# Patient Record
Sex: Male | Born: 1950 | Race: White | Hispanic: No | Marital: Married | Smoking: Former smoker
Health system: Southern US, Community
[De-identification: ages and names within clinical notes are randomized; demographics above are authoritative.]

## PROBLEM LIST (undated history)

## (undated) DIAGNOSIS — Z8719 Personal history of other diseases of the digestive system: Secondary | ICD-10-CM

## (undated) DIAGNOSIS — K5792 Diverticulitis of intestine, part unspecified, without perforation or abscess without bleeding: Secondary | ICD-10-CM

## (undated) DIAGNOSIS — N400 Enlarged prostate without lower urinary tract symptoms: Secondary | ICD-10-CM

## (undated) DIAGNOSIS — I10 Essential (primary) hypertension: Secondary | ICD-10-CM

## (undated) DIAGNOSIS — J189 Pneumonia, unspecified organism: Secondary | ICD-10-CM

## (undated) DIAGNOSIS — E785 Hyperlipidemia, unspecified: Secondary | ICD-10-CM

## (undated) DIAGNOSIS — K219 Gastro-esophageal reflux disease without esophagitis: Secondary | ICD-10-CM

## (undated) DIAGNOSIS — J45909 Unspecified asthma, uncomplicated: Secondary | ICD-10-CM

## (undated) DIAGNOSIS — E213 Hyperparathyroidism, unspecified: Secondary | ICD-10-CM

## (undated) DIAGNOSIS — R631 Polydipsia: Secondary | ICD-10-CM

## (undated) DIAGNOSIS — M1990 Unspecified osteoarthritis, unspecified site: Secondary | ICD-10-CM

## (undated) HISTORY — DX: Polydipsia: R63.1

## (undated) HISTORY — DX: Gastro-esophageal reflux disease without esophagitis: K21.9

## (undated) HISTORY — PX: TONSILLECTOMY AND ADENOIDECTOMY: SUR1326

## (undated) HISTORY — PX: HERNIA REPAIR: SHX51

## (undated) HISTORY — DX: Diverticulitis of intestine, part unspecified, without perforation or abscess without bleeding: K57.92

## (undated) HISTORY — DX: Essential (primary) hypertension: I10

## (undated) HISTORY — DX: Hyperparathyroidism, unspecified: E21.3

## (undated) HISTORY — DX: Hyperlipidemia, unspecified: E78.5

## (undated) HISTORY — DX: Unspecified asthma, uncomplicated: J45.909

## (undated) HISTORY — DX: Benign prostatic hyperplasia without lower urinary tract symptoms: N40.0

---

## 2016-10-02 HISTORY — DX: Hypercalcemia: E83.52

## 2017-07-09 ENCOUNTER — Telehealth: Payer: Self-pay | Admitting: Internal Medicine

## 2017-07-09 NOTE — Telephone Encounter (Signed)
Patient just moved here from [place].  Would like to have an internist.  Would you be able to take patient on?

## 2017-07-09 NOTE — Telephone Encounter (Signed)
Pt called back an has decided to sch with [facility]

## 2017-08-20 ENCOUNTER — Encounter: Payer: Self-pay | Admitting: Nurse Practitioner

## 2017-08-20 ENCOUNTER — Ambulatory Visit (INDEPENDENT_AMBULATORY_CARE_PROVIDER_SITE_OTHER): Payer: Medicare Other | Admitting: Nurse Practitioner

## 2017-08-20 ENCOUNTER — Other Ambulatory Visit (INDEPENDENT_AMBULATORY_CARE_PROVIDER_SITE_OTHER): Payer: Medicare Other

## 2017-08-20 VITALS — BP 138/76 | HR 62 | Temp 98.5°F | Resp 16 | Ht 70.0 in | Wt 200.0 lb

## 2017-08-20 DIAGNOSIS — K5792 Diverticulitis of intestine, part unspecified, without perforation or abscess without bleeding: Secondary | ICD-10-CM | POA: Insufficient documentation

## 2017-08-20 DIAGNOSIS — R972 Elevated prostate specific antigen [PSA]: Secondary | ICD-10-CM | POA: Diagnosis not present

## 2017-08-20 DIAGNOSIS — F4310 Post-traumatic stress disorder, unspecified: Secondary | ICD-10-CM

## 2017-08-20 DIAGNOSIS — E785 Hyperlipidemia, unspecified: Secondary | ICD-10-CM | POA: Insufficient documentation

## 2017-08-20 DIAGNOSIS — N400 Enlarged prostate without lower urinary tract symptoms: Secondary | ICD-10-CM | POA: Diagnosis not present

## 2017-08-20 DIAGNOSIS — I10 Essential (primary) hypertension: Secondary | ICD-10-CM | POA: Diagnosis not present

## 2017-08-20 DIAGNOSIS — K219 Gastro-esophageal reflux disease without esophagitis: Secondary | ICD-10-CM | POA: Insufficient documentation

## 2017-08-20 DIAGNOSIS — Z7289 Other problems related to lifestyle: Secondary | ICD-10-CM

## 2017-08-20 DIAGNOSIS — Z789 Other specified health status: Secondary | ICD-10-CM | POA: Diagnosis not present

## 2017-08-20 LAB — COMPREHENSIVE METABOLIC PANEL
ALBUMIN: 4.6 g/dL (ref 3.5–5.2)
ALT: 24 U/L (ref 0–53)
AST: 18 U/L (ref 0–37)
Alkaline Phosphatase: 60 U/L (ref 39–117)
BUN: 21 mg/dL (ref 6–23)
CALCIUM: 10.7 mg/dL — AB (ref 8.4–10.5)
CHLORIDE: 105 meq/L (ref 96–112)
CO2: 28 mEq/L (ref 19–32)
CREATININE: 1.19 mg/dL (ref 0.40–1.50)
GFR: 65.19 mL/min (ref >60.00)
Glucose, Bld: 89 mg/dL (ref 70–99)
Potassium: 4.8 mEq/L (ref 3.5–5.1)
Sodium: 137 mEq/L (ref 135–145)
Total Bilirubin: 0.8 mg/dL (ref 0.2–1.2)
Total Protein: 7 g/dL (ref 6.0–8.3)

## 2017-08-20 LAB — PSA: PSA: 8.08 ng/mL — ABNORMAL HIGH (ref 0.10–4.00)

## 2017-08-20 MED ORDER — SIMVASTATIN 20 MG PO TABS: 20.0000 mg | ORAL_TABLET | Freq: Every day | ORAL | 2 refills | Status: DC

## 2017-08-20 MED ORDER — SERTRALINE HCL 100 MG PO TABS: 100.0000 mg | ORAL_TABLET | Freq: Every day | ORAL | 2 refills | Status: DC

## 2017-08-20 MED ORDER — PRAZOSIN HCL 5 MG PO CAPS: 5.0000 mg | ORAL_CAPSULE | Freq: Two times a day (BID) | ORAL | 2 refills | Status: DC

## 2017-08-20 NOTE — Assessment & Plan Note (Signed)
Readings below goal of 140/90 in office today. Maintained on prazosin 5mg  twice daily as cotherapy for HTN/BPH. Denies adverse medication effects. Denies headaches, vision changes, chest pain, shortness of breath. Will continue prazosin at current dose. Healthy diet and exercise, BP logs at home discussed CMET today.

## 2017-08-20 NOTE — Assessment & Plan Note (Addendum)
Heavy alcohol use, sometimes 1 bottle of wine a day, but does not drink every single day. He is not ready to quit drinking. He declines referral to psychiatry, alcohol resources.

## 2017-08-20 NOTE — Assessment & Plan Note (Signed)
Maintained on prazosin 5mg  twice daily. He reports elevated PSA readings in past despite prazosin therapy. He was seeing a urologist in [place] but stopped follow up because he did not want to continue routine prostate biopsies. PSA today, will maintain prazosin for now.

## 2017-08-20 NOTE — Progress Notes (Addendum)
Subjective:    Patient ID: Matthew Walsh, male    DOB: 12-28-1950, 66 y.o.   MRN: [id number]  HPI Matthew Walsh is who presents today to establish care. He has just moved to [place] from [place] and in need of a new primary care provider.  Hypertension-maintained on prazosin 5mg  twice daily as co therapy for BPH. He denies headaches, vision changes,chest pain, shortness of breath, adverse medication reactions. He does not check his blood pressure routinely. He reports normal readings in the past on his current medication dose. BP today 138/76.  PTSD- diagnosed 5 years ago by psychiatrist. He has been maintained on zoloft daily since, which he feels controls his symptoms well. He denies agitation, anxiety, adverse medication effects.  Alcohol use- He reports heavy alcohol use, he has been drinking daily recenlty. He does go some days, even weeks, without drinking. He was prescribed ativan in the past to help with his PTSD and alcohol use but ran out about 1 month ago. He has thought of quitting drinking, but hes not ready. He denies any tremor, withdrawal symptoms. hes not interested in referral to psychiatry or alcohol resources.  BPH-maintained on prazosin daily as cotherapy for HTN.  Elevated prostate readings in the past. He was following a urologist in [place] for routine prostate biopsies but stopped going because grew tired of getting repeat biopsies. He was taking saw palmetto and psyllium husks daily as herbal therapy for BPH with some decrease in is PSA level in the past, but is currently not taking either herbal therapy.  Hyperlipidemia- maintained on zocor. Reports daily compliance. Denies adverse medication reactions, myalgias.   Review of Systems  Constitutional: Negative.  Negative for activity change and appetite change.  HENT: Negative for dental problem.   Eyes: Negative for visual disturbance.  Respiratory: Negative for shortness of breath.   Cardiovascular:  Negative for chest pain, palpitations and leg swelling.  Gastrointestinal: Negative for nausea.  Endocrine: Negative for cold intolerance and heat intolerance.  Genitourinary: Negative for difficulty urinating.  Musculoskeletal: Negative for arthralgias and myalgias.  Skin: Negative for rash.  Allergic/Immunologic: Negative for environmental allergies and food allergies.  Neurological: Negative for dizziness, speech difficulty, weakness and headaches.  Hematological: Does not bruise/bleed easily.  Psychiatric/Behavioral: Negative for agitation and confusion.    Past Medical History:  Diagnosis Date  . Asthma    As a child  . Diverticulitis   . GERD (gastroesophageal reflux disease)   . Hypertension      Social History   Socioeconomic History  . Marital status: Married    Spouse name: Not on file  . Number of children: Not on file  . Years of education: Not on file  . Highest education level: Not on file  Social Needs  . Financial resource strain: Not on file  . Food insecurity - worry: Not on file  . Food insecurity - inability: Not on file  . Transportation needs - medical: Not on file  . Transportation needs - non-medical: Not on file  Occupational History  . Not on file  Tobacco Use  . Smoking status: Former Research scientist (life sciences)  . Smokeless tobacco: Never Used  Substance and Sexual Activity  . Alcohol use: Yes  . Drug use: No  . Sexual activity: Yes    Partners: Female  Other Topics Concern  . Not on file  Social History Narrative   Fun- retired, just moved to area.   Neighborhood clubs    Past Surgical History:  Procedure Laterality  Date  . HERNIA REPAIR    . TONSILLECTOMY AND ADENOIDECTOMY      Family History  Problem Relation Age of Onset  . Diabetes Mother   . Heart attack Mother   . Miscarriages / [place] Mother   . Alcohol abuse Father   . Early death Father   . Hearing loss Father   . Heart disease Father   . Hyperlipidemia Father   . Hypertension  Father   . Alcohol abuse Brother   . Alcohol abuse Maternal Grandmother   . Diabetes Maternal Grandmother   . Hearing loss Maternal Grandmother   . Heart disease Maternal Grandmother   . Hearing loss Maternal Grandfather   . Heart disease Maternal Grandfather   . Heart disease Paternal Grandmother   . Heart disease Paternal Grandfather     No Known Allergies  Current Outpatient Medications on File Prior to Visit  Medication Sig Dispense Refill  . LORazepam (ATIVAN) 0.5 MG tablet Take 0.5 mg daily by mouth. Take 1 tablet by mouth once daily as needed for anxiety.    . Methylcellulose, Laxative, (FIBER THERAPY PO) Take by mouth.    . prazosin (MINIPRESS) 5 MG capsule Take 5 mg 2 (two) times daily by mouth.    . sertraline (ZOLOFT) 100 MG tablet Take 100 mg daily by mouth.    . simvastatin (ZOCOR) 20 MG tablet Take 20 mg daily by mouth.     No current facility-administered medications on file prior to visit.     BP 138/76 (BP Location: Left Arm, Patient Position: Sitting, Cuff Size: Large)   Pulse 62   Temp 98.5 F (36.9 C) (Oral)   Resp 16   Ht 5\' 10"  (1.778 m)   Wt 200 lb (90.7 kg)   SpO2 97%   BMI 28.70 kg/m       Objective:   Physical Exam  Constitutional: He is oriented to person, place, and time. He appears well-developed and well-nourished. No distress.  HENT:  Head: Normocephalic and atraumatic.  Cardiovascular: Normal rate, regular rhythm, normal heart sounds and intact distal pulses.  Pulmonary/Chest: Effort normal and breath sounds normal.  Neurological: He is alert and oriented to person, place, and time. Coordination normal. GCS eye subscore is 4. GCS verbal subscore is 5. GCS motor subscore is 6.  Skin: Skin is warm and dry.  Psychiatric: He has a normal mood and affect. His behavior is normal. Judgment and thought content normal.      Assessment & Plan:  Matthew Walsh return at his convenience for an annual physical, or in 6 months for follow up of chronic  conditions.

## 2017-08-20 NOTE — Patient Instructions (Addendum)
Please head downstairs for lab work.  I have sent refills of your zoloft, zocor, minipress.  Return at your convenience for an annual physical.  It was nice to meet you. Welcome to [facility]!

## 2017-08-20 NOTE — Assessment & Plan Note (Signed)
Maintained on zoloft. Denies adverse reactions or suicidal thoughts. Denies recent severe anxiety, flashbacks. Continue zoloft current dose

## 2017-08-20 NOTE — Assessment & Plan Note (Signed)
Maintained on zocor. Reports daily medication compliance. Denies adverse medication effects, myalgias. Continue current dose. He will return for annual physical with fasting lipid panel at his convenience.

## 2017-08-27 ENCOUNTER — Telehealth: Payer: Self-pay | Admitting: Nurse Practitioner

## 2017-08-27 DIAGNOSIS — E785 Hyperlipidemia, unspecified: Secondary | ICD-10-CM

## 2017-08-27 DIAGNOSIS — F4310 Post-traumatic stress disorder, unspecified: Secondary | ICD-10-CM

## 2017-08-27 DIAGNOSIS — R972 Elevated prostate specific antigen [PSA]: Secondary | ICD-10-CM

## 2017-08-27 DIAGNOSIS — I10 Essential (primary) hypertension: Secondary | ICD-10-CM

## 2017-08-27 MED ORDER — SERTRALINE HCL 100 MG PO TABS: 100.0000 mg | ORAL_TABLET | Freq: Every day | ORAL | 2 refills | Status: DC

## 2017-08-27 MED ORDER — SIMVASTATIN 20 MG PO TABS: 20.0000 mg | ORAL_TABLET | Freq: Every day | ORAL | 2 refills | Status: DC

## 2017-08-27 MED ORDER — PRAZOSIN HCL 5 MG PO CAPS: 5.0000 mg | ORAL_CAPSULE | Freq: Two times a day (BID) | ORAL | 2 refills | Status: DC

## 2017-08-27 NOTE — Telephone Encounter (Signed)
Pt called and would like [facility] medications [facility]  Changed pharmacies, chart updated  Please advise

## 2017-08-27 NOTE — Telephone Encounter (Signed)
Resent script to [facility]...Johny Chess

## 2017-09-23 ENCOUNTER — Encounter (HOSPITAL_COMMUNITY): Payer: Self-pay | Admitting: Emergency Medicine

## 2017-09-23 ENCOUNTER — Ambulatory Visit (INDEPENDENT_AMBULATORY_CARE_PROVIDER_SITE_OTHER): Payer: Medicare Other

## 2017-09-23 ENCOUNTER — Ambulatory Visit (HOSPITAL_COMMUNITY)
Admission: EM | Admit: 2017-09-23 | Discharge: 2017-09-23 | Disposition: A | Discharge status: Home / Self Care | Payer: Medicare Other | Attending: Family Medicine | Admitting: Family Medicine

## 2017-09-23 DIAGNOSIS — R05 Cough: Secondary | ICD-10-CM | POA: Diagnosis not present

## 2017-09-23 DIAGNOSIS — J069 Acute upper respiratory infection, unspecified: Secondary | ICD-10-CM

## 2017-09-23 MED ORDER — PREDNISONE 20 MG PO TABS: ORAL_TABLET | ORAL | 0 refills | Status: DC

## 2017-09-23 MED ORDER — LIDOCAINE HCL (PF) 1 % IJ SOLN
INTRAMUSCULAR | Status: AC
  Filled 2017-09-23: qty 2

## 2017-09-23 MED ORDER — LEVOFLOXACIN 500 MG PO TABS: 500.0000 mg | ORAL_TABLET | Freq: Every day | ORAL | 0 refills | Status: DC

## 2017-09-23 MED ORDER — CEFTRIAXONE SODIUM 1 G IJ SOLR
INTRAMUSCULAR | Status: AC
  Filled 2017-09-23: qty 10

## 2017-09-23 MED ORDER — HYDROCODONE-HOMATROPINE 5-1.5 MG/5ML PO SYRP: 5.0000 mL | ORAL_SOLUTION | Freq: Four times a day (QID) | ORAL | 0 refills | Status: DC | PRN

## 2017-09-23 MED ORDER — CEFTRIAXONE SODIUM 1 G IJ SOLR
1.0000 g | Freq: Once | INTRAMUSCULAR | Status: AC
  Administered 2017-09-23: 1 g via INTRAMUSCULAR

## 2017-09-23 NOTE — ED Provider Notes (Signed)
[place]   [id number] 09/23/17 Arrival Time: 1206   SUBJECTIVE:  Matthew Walsh is a 66 y.o. male who presents to the urgent care with complaint of horrible cough, worse at night, associated with low grade temp and some shortness of breath.  No chest pain.  H/o asthma as child.   Past Medical History:  Diagnosis Date  . Asthma    As a child  . Diverticulitis   . GERD (gastroesophageal reflux disease)   . Hypertension    Family History  Problem Relation Age of Onset  . Diabetes Mother   . Heart attack Mother   . Miscarriages / [place] Mother   . Alcohol abuse Father   . Early death Father   . Hearing loss Father   . Heart disease Father   . Hyperlipidemia Father   . Hypertension Father   . Alcohol abuse Brother   . Alcohol abuse Maternal Grandmother   . Diabetes Maternal Grandmother   . Hearing loss Maternal Grandmother   . Heart disease Maternal Grandmother   . Hearing loss Maternal Grandfather   . Heart disease Maternal Grandfather   . Heart disease Paternal Grandmother   . Heart disease Paternal Grandfather    Social History   Socioeconomic History  . Marital status: Married    Spouse name: Not on file  . Number of children: Not on file  . Years of education: Not on file  . Highest education level: Not on file  Social Needs  . Financial resource strain: Not on file  . Food insecurity - worry: Not on file  . Food insecurity - inability: Not on file  . Transportation needs - medical: Not on file  . Transportation needs - non-medical: Not on file  Occupational History  . Not on file  Tobacco Use  . Smoking status: Former Research scientist (life sciences)  . Smokeless tobacco: Never Used  Substance and Sexual Activity  . Alcohol use: Yes    Comment: daily  . Drug use: No  . Sexual activity: Yes    Partners: Female  Other Topics Concern  . Not on file  Social History Narrative   Fun- retired, just moved to area.   Neighborhood clubs   No outpatient  medications have been marked as taking for the 09/23/17 encounter [facility] Encounter).   No Known Allergies    ROS: As per HPI, remainder of ROS negative.   OBJECTIVE:   Vitals:   09/23/17 1242  BP: 135/79  Pulse: 76  Resp: 18  Temp: 99.4 F (37.4 C)  TempSrc: Oral  SpO2: 93%     General appearance: alert; no distress Eyes: PERRL; EOMI; conjunctiva normal HENT: normocephalic; atraumatic;  external ears normal without trauma; nasal mucosa normal; oral mucosa normal Neck: supple Lungs: rales and exp wheezes to auscultation RLL Heart: regular rate and rhythm Abdomen: soft, non-tender; bowel sounds normal; no masses or organomegaly; no guarding or rebound tenderness Back: no CVA tenderness Extremities: no cyanosis or edema; symmetrical with no gross deformities Skin: warm and dry Neurologic: normal gait; grossly normal Psychological: alert and cooperative; normal mood and affect      Labs:  Results for orders placed or performed in visit on 08/20/17  PSA  Result Value Ref Range   PSA 8.08 (H) 0.10 - 4.00 ng/mL  Comprehensive metabolic panel  Result Value Ref Range   Sodium 137 135 - 145 mEq/L   Potassium 4.8 3.5 - 5.1 mEq/L   Chloride 105 96 - 112 mEq/L  CO2 28 19 - 32 mEq/L   Glucose, Bld 89 70 - 99 mg/dL   BUN 21 6 - 23 mg/dL   Creatinine, Ser 1.19 0.40 - 1.50 mg/dL   Total Bilirubin 0.8 0.2 - 1.2 mg/dL   Alkaline Phosphatase 60 39 - 117 U/L   AST 18 0 - 37 U/L   ALT 24 0 - 53 U/L   Total Protein 7.0 6.0 - 8.3 g/dL   Albumin 4.6 3.5 - 5.2 g/dL   Calcium 10.7 (H) 8.4 - 10.5 mg/dL   GFR 65.19 >60.00 mL/min    Labs Reviewed - No data to display  Dg Chest 2 View  Result Date: 09/23/2017 CLINICAL DATA:  66 year old male with productive cough EXAM: CHEST  2 VIEW COMPARISON:  None. FINDINGS: The lungs are clear and negative for focal airspace consolidation, pulmonary edema or suspicious pulmonary nodule. No pleural effusion or pneumothorax. Cardiac and  mediastinal contours are within normal limits. No acute fracture or lytic or blastic osseous lesions. The visualized upper abdominal bowel gas pattern is unremarkable. IMPRESSION: Negative chest x-ray. Electronically Signed   By: Jacqulynn Cadet M.D.   On: 09/23/2017 12:56       ASSESSMENT & PLAN:  1. Acute upper respiratory infection     Meds ordered this encounter  Medications  . levofloxacin (LEVAQUIN) 500 MG tablet    Sig: Take 1 tablet (500 mg total) by mouth daily.    Dispense:  7 tablet    Refill:  0  . predniSONE (DELTASONE) 20 MG tablet    Sig: Two daily with food    Dispense:  10 tablet    Refill:  0  . HYDROcodone-homatropine (HYDROMET) 5-1.5 MG/5ML syrup    Sig: Take 5 mLs by mouth every 6 (six) hours as needed for cough.    Dispense:  60 mL    Refill:  0  . cefTRIAXone (ROCEPHIN) injection 1 g    Reviewed expectations re: course of current medical issues. Questions answered. Outlined signs and symptoms indicating need for more acute intervention. Patient verbalized understanding. After Visit Summary given.    Procedures:      Robyn Haber, MD 09/23/17 1323

## 2017-09-23 NOTE — ED Triage Notes (Signed)
PT C/O: cold sx associated w/prod, nasal congestion, HA  ONSET: 5 days  DENIES: fevers  TAKING MEDS: OTC cough syrup  A&O x4... NAD... Ambulatory

## 2017-09-23 NOTE — Discharge Instructions (Signed)
Although the x-ray doesn't show the pneumonia at this point, your exam has all of the features.  Therefore, we are treating you for pneumonia.  You should see significant improvement in 24 hours or return here tomorrow.

## 2017-09-26 ENCOUNTER — Telehealth (HOSPITAL_COMMUNITY): Payer: Self-pay | Admitting: Emergency Medicine

## 2017-09-26 MED ORDER — ALBUTEROL SULFATE HFA 108 (90 BASE) MCG/ACT IN AERS: 1.0000 | INHALATION_SPRAY | Freq: Four times a day (QID) | RESPIRATORY_TRACT | 0 refills | Status: DC | PRN

## 2017-09-26 MED ORDER — SPACER/AERO CHAMBER MOUTHPIECE MISC: 0 refills | Status: DC

## 2017-09-26 NOTE — Telephone Encounter (Signed)
Pt called stating that he is not feeling any better after being seen here on 09/23/17   Reports persistent cough but denies fevers, SOB, dyspnea  Wants to know what we can do or if he needs to come in  Per Dr. Joseph Art, who saw him on 12/23, ok to call albuterol in along w/a spacer   1-2 puffs Q6 hours PRN   Called into [facility] [employer])

## 2018-01-16 ENCOUNTER — Other Ambulatory Visit (INDEPENDENT_AMBULATORY_CARE_PROVIDER_SITE_OTHER): Payer: Medicare Other

## 2018-01-16 ENCOUNTER — Ambulatory Visit (INDEPENDENT_AMBULATORY_CARE_PROVIDER_SITE_OTHER): Payer: Medicare Other | Admitting: Nurse Practitioner

## 2018-01-16 ENCOUNTER — Encounter: Payer: Self-pay | Admitting: Nurse Practitioner

## 2018-01-16 VITALS — BP 124/80 | HR 69 | Temp 97.8°F | Resp 16 | Ht 70.0 in | Wt 208.0 lb

## 2018-01-16 DIAGNOSIS — F4310 Post-traumatic stress disorder, unspecified: Secondary | ICD-10-CM | POA: Diagnosis not present

## 2018-01-16 DIAGNOSIS — R7309 Other abnormal glucose: Secondary | ICD-10-CM

## 2018-01-16 DIAGNOSIS — N400 Enlarged prostate without lower urinary tract symptoms: Secondary | ICD-10-CM

## 2018-01-16 DIAGNOSIS — Z1159 Encounter for screening for other viral diseases: Secondary | ICD-10-CM | POA: Diagnosis not present

## 2018-01-16 DIAGNOSIS — E785 Hyperlipidemia, unspecified: Secondary | ICD-10-CM | POA: Diagnosis not present

## 2018-01-16 DIAGNOSIS — I10 Essential (primary) hypertension: Secondary | ICD-10-CM

## 2018-01-16 DIAGNOSIS — Z0001 Encounter for general adult medical examination with abnormal findings: Secondary | ICD-10-CM

## 2018-01-16 DIAGNOSIS — Z9189 Other specified personal risk factors, not elsewhere classified: Secondary | ICD-10-CM

## 2018-01-16 DIAGNOSIS — Z23 Encounter for immunization: Secondary | ICD-10-CM

## 2018-01-16 LAB — COMPREHENSIVE METABOLIC PANEL
ALBUMIN: 4.6 g/dL (ref 3.5–5.2)
ALT: 21 U/L (ref 0–53)
AST: 14 U/L (ref 0–37)
Alkaline Phosphatase: 54 U/L (ref 39–117)
BILIRUBIN TOTAL: 0.6 mg/dL (ref 0.2–1.2)
BUN: 21 mg/dL (ref 6–23)
CALCIUM: 10.4 mg/dL (ref 8.4–10.5)
CHLORIDE: 107 meq/L (ref 96–112)
CO2: 24 mEq/L (ref 19–32)
CREATININE: 1.2 mg/dL (ref 0.40–1.50)
GFR: 64.48 mL/min (ref >60.00)
Glucose, Bld: 103 mg/dL — ABNORMAL HIGH (ref 70–99)
Potassium: 4.7 mEq/L (ref 3.5–5.1)
SODIUM: 141 meq/L (ref 135–145)
Total Protein: 6.9 g/dL (ref 6.0–8.3)

## 2018-01-16 LAB — LIPID PANEL
CHOLESTEROL: 183 mg/dL (ref 0–200)
HDL: 35.5 mg/dL — ABNORMAL LOW (ref >39.00)
LDL CALC: 115 mg/dL — AB (ref 0–99)
NonHDL: 147.35
Total CHOL/HDL Ratio: 5
Triglycerides: 164 mg/dL — ABNORMAL HIGH (ref 0.0–149.0)
VLDL: 32.8 mg/dL (ref 0.0–40.0)

## 2018-01-16 LAB — PSA: PSA: 5.1 ng/mL — AB (ref 0.10–4.00)

## 2018-01-16 LAB — CBC
HEMATOCRIT: 42.8 % (ref 39.0–52.0)
Hemoglobin: 14.7 g/dL (ref 13.0–17.0)
MCHC: 34.3 g/dL (ref 30.0–36.0)
MCV: 91.4 fl (ref 78.0–100.0)
Platelets: 177 10*3/uL (ref 150.0–400.0)
RBC: 4.68 Mil/uL (ref 4.22–5.81)
RDW: 13.1 % (ref 11.5–15.5)
WBC: 6.6 10*3/uL (ref 4.0–10.5)

## 2018-01-16 LAB — HEMOGLOBIN A1C: HEMOGLOBIN A1C: 5.9 % (ref 4.6–6.5)

## 2018-01-16 MED ORDER — SIMVASTATIN 20 MG PO TABS: 20.0000 mg | ORAL_TABLET | Freq: Every day | ORAL | 2 refills | Status: DC

## 2018-01-16 MED ORDER — SERTRALINE HCL 100 MG PO TABS: 100.0000 mg | ORAL_TABLET | Freq: Every day | ORAL | 2 refills | Status: DC

## 2018-01-16 MED ORDER — PRAZOSIN HCL 5 MG PO CAPS: 5.0000 mg | ORAL_CAPSULE | Freq: Two times a day (BID) | ORAL | 2 refills | Status: DC

## 2018-01-16 NOTE — Assessment & Plan Note (Signed)
-  Prostate cancer screening and PSA options (with potential risks and benefits of testing vs not testing) were discussed along with recent recs/guidelines. -USPSTF grade A and B recommendations reviewed with patient; age-appropriate recommendations, preventive care, screening tests, etc discussed and encouraged; healthy living encouraged; see AVS for patient education given to patient -Discussed importance of 150 minutes of physical activity weekly,  eat 6 servings of fruit/vegetables daily and drink plenty of water and avoid sweet beverages.  -Follow up and care instructions discussed and provided in AVS.   -Reviewed Health Maintenance:  Need for vaccination with 13-polyvalent pneumococcal conjugate vaccine- Pneumococcal conjugate vaccine 13-valent Encounter for hepatitis C virus screening test for high risk patient- Hepatitis C antibody; Future  Elevated random blood glucose level- Hemoglobin A1c; Future

## 2018-01-16 NOTE — Assessment & Plan Note (Signed)
Stable, continue current medications - CBC; Future - Comprehensive metabolic panel; Future - prazosin (MINIPRESS) 5 MG capsule; Take 1 capsule (5 mg total) by mouth 2 (two) times daily.  Dispense: 180 capsule; Refill: 2

## 2018-01-16 NOTE — Assessment & Plan Note (Signed)
Continue current meds Update labs - PSA; Future - prazosin (MINIPRESS) 5 MG capsule; Take 1 capsule (5 mg total) by mouth 2 (two) times daily.  Dispense: 180 capsule; Refill: 2

## 2018-01-16 NOTE — Assessment & Plan Note (Addendum)
Stable, continue zoloft - sertraline (ZOLOFT) 100 MG tablet; Take 1 tablet (100 mg total) by mouth daily.  Dispense: 90 tablet; Refill: 2

## 2018-01-16 NOTE — Assessment & Plan Note (Addendum)
Interested in stopping statin Discussed the role of healthy diet and exercise in the management of hyperlipidemia  will update labs and F/u with recommendations-he is fasting today Continue simvastatin for now - CBC; Future - Comprehensive metabolic panel; Future - Lipid panel; Future

## 2018-01-16 NOTE — Patient Instructions (Signed)
Please head downstairs for lab work/x-rays. If any of your test results are critically abnormal, you will be contacted right away. Your results may be released to your MyChart for viewing before I am able to provide you with my response. I will contact you within a week about your test results and any recommendations for abnormalities.  Work on [employer] and exercise as we discussed. Remember half of your plate should be veggies, one-fourth carbs, one-fourth meat, and don't eat meat at every meal. Also, remember to stay away from sugary drinks. I'd like for you to start incorporating exercise into your daily schedule. Start at 10 minutes a day, working up to 30 minutes five times a week.   I will plan to see you back in 1 year for your annual physical, of course sooner if you need me.   Health Maintenance, Male A healthy lifestyle and preventive care is important for your health and wellness. Ask your health care provider about what schedule of regular examinations is right for you. What should I know about weight and diet? Eat a Healthy Diet  Eat plenty of vegetables, fruits, whole grains, low-fat dairy products, and lean protein.  Do not eat a lot of foods high in solid fats, added sugars, or salt.  Maintain a Healthy Weight Regular exercise can help you achieve or maintain a healthy weight. You should:  Do at least 150 minutes of exercise each week. The exercise should increase your heart rate and make you sweat (moderate-intensity exercise).  Do strength-training exercises at least twice a week.  Watch Your Levels of Cholesterol and Blood Lipids  Have your blood tested for lipids and cholesterol every 5 years starting at 66 years of age. If you are at high risk for heart disease, you should start having your blood tested when you are 66 years old. You may need to have your cholesterol levels checked more often if: ? Your lipid or cholesterol levels are high. ? You are older than  66 years of age. ? You are at high risk for heart disease.  What should I know about cancer screening? Many types of cancers can be detected early and may often be prevented. Lung Cancer  You should be screened every year for lung cancer if: ? You are a current smoker who has smoked for at least 30 years. ? You are a former smoker who has quit within the past 15 years.  Talk to your health care provider about your screening options, when you should start screening, and how often you should be screened.  Colorectal Cancer  Routine colorectal cancer screening usually begins at 66 years of age and should be repeated every 5-10 years until you are 66 years old. You may need to be screened more often if early forms of precancerous polyps or small growths are found. Your health care provider may recommend screening at an earlier age if you have risk factors for colon cancer.  Your health care provider may recommend using home test kits to check for hidden blood in the stool.  A small camera at the end of a tube can be used to examine your colon (sigmoidoscopy or colonoscopy). This checks for the earliest forms of colorectal cancer.  Prostate and Testicular Cancer  Depending on your age and overall health, your health care provider may do certain tests to screen for prostate and testicular cancer.  Talk to your health care provider about any symptoms or concerns you have about testicular  or prostate cancer.  Skin Cancer  Check your skin from head to toe regularly.  Tell your health care provider about any new moles or changes in moles, especially if: ? There is a change in a mole's size, shape, or color. ? You have a mole that is larger than a pencil eraser.  Always use sunscreen. Apply sunscreen liberally and repeat throughout the day.  Protect yourself by wearing long sleeves, pants, a wide-brimmed hat, and sunglasses when outside.  What should I know about heart disease, diabetes,  and high blood pressure?  If you are 27-34 years of age, have your blood pressure checked every 3-5 years. If you are 14 years of age or older, have your blood pressure checked every year. You should have your blood pressure measured twice-once when you are at a hospital or clinic, and once when you are not at a hospital or clinic. Record the average of the two measurements. To check your blood pressure when you are not at a hospital or clinic, you can use: ? An automated blood pressure machine at a pharmacy. ? A home blood pressure monitor.  Talk to your health care provider about your target blood pressure.  If you are between 41-20 years old, ask your health care provider if you should take aspirin to prevent heart disease.  Have regular diabetes screenings by checking your fasting blood sugar level. ? If you are at a normal weight and have a low risk for diabetes, have this test once every three years after the age of 57. ? If you are overweight and have a high risk for diabetes, consider being tested at a younger age or more often.  A one-time screening for abdominal aortic aneurysm (AAA) by ultrasound is recommended for men aged 75-75 years who are current or former smokers. What should I know about preventing infection? Hepatitis B If you have a higher risk for hepatitis B, you should be screened for this virus. Talk with your health care provider to find out if you are at risk for hepatitis B infection. Hepatitis C Blood testing is recommended for:  Everyone born from [birth year] through 1964.  Anyone with known risk factors for hepatitis C.  Sexually Transmitted Diseases (STDs)  You should be screened each year for STDs including gonorrhea and chlamydia if: ? You are sexually active and are younger than 66 years of age. ? You are older than 66 years of age and your health care provider tells you that you are at risk for this type of infection. ? Your sexual activity has changed since  you were last screened and you are at an increased risk for chlamydia or gonorrhea. Ask your health care provider if you are at risk.  Talk with your health care provider about whether you are at high risk of being infected with HIV. Your health care provider may recommend a prescription medicine to help prevent HIV infection.  What else can I do?  Schedule regular health, dental, and eye exams.  Stay current with your vaccines (immunizations).  Do not use any tobacco products, such as cigarettes, chewing tobacco, and e-cigarettes. If you need help quitting, ask your health care provider.  Limit alcohol intake to no more than 2 drinks per day. One drink equals 12 ounces of beer, 5 ounces of wine, or 1 ounces of hard liquor.  Do not use street drugs.  Do not share needles.  Ask your health care provider for help if you need support  or information about quitting drugs.  Tell your health care provider if you often feel depressed.  Tell your health care provider if you have ever been abused or do not feel safe at home. This information is not intended to replace advice given to you by your health care provider. Make sure you discuss any questions you have with your health care provider. Document Released: 03/16/2008 Document Revised: 05/17/2016 Document Reviewed: 06/22/2015 Elsevier Interactive Patient Education  [employer].

## 2018-01-16 NOTE — Progress Notes (Signed)
Name: Matthew Walsh   MRN: [id number]    DOB: [birth year]   Date:01/16/2018       Progress Note  Subjective  Chief Complaint  Chief Complaint  Patient presents with  . Establish Care    CPE, fasting    HPI  Patient presents for annual CPE  USPSTF grade A and B recommendations:  Diet: tries to eat healthy, no red meat, daily vegetables Exercise: gym occasionally   Depression: no concerns for anxiety or depression Maintained on zoloft for PTSD Also doing acupuncture which seems to be helpful Depression screen [facility] 2/9 08/20/2017  Decreased Interest 0  Down, Depressed, Hopeless 0  PHQ - 2 Score 0   Hypertension: maintained on prazosin as therapy for HTN and BPH Reports taking prazosin daily as prescribed with no noted adverse reACTIONS BP Readings from Last 3 Encounters:  01/16/18 124/80  09/23/17 135/79  08/20/17 138/76   Obesity: Wt Readings from Last 3 Encounters:  01/16/18 208 lb (94.3 kg)  08/20/17 200 lb (90.7 kg)   BMI Readings from Last 3 Encounters:  01/16/18 29.84 kg/m  08/20/17 28.70 kg/m    Lipids: maintained on simvastatin 20 daily. Reports taking medication daily as prescribed, no noted adverse effects. Would like to try to stop taking the statin at some point No results found for: CHOL No results found for: HDL No results found for: LDLCALC No results found for: TRIG No results found for: CHOLHDL No results found for: LDLDIRECT  Glucose:  Glucose, Bld  Date Value Ref Range Status  08/20/2017 89 70 - 99 mg/dL Final   Alcohol: greatly reduced alcohol- 1 drink per month Tobacco use: former, quit 40 years ago- smoked about 10 years total  HIV, hep C: declines HIV screening, will order Hep c screening today  Skin cancer: no concerning lesions or moles  Colorectal cancer: No personal or family history of colon ca, no abdominal pain, no bowel changes, no rectal bleeding. Colonoscopy up to date  Prostate cancer: hx BPH- maintained on prazosin,  also taking saw palmetto OTC, will update PSA today Lab Results  Component Value Date   PSA 8.08 (H) 08/20/2017   IPSS Questionnaire (AUA-7): Over the past month.   1)  How often have you had a sensation of not emptying your bladder completely after you finish urinating?  0 - Not at all  2)  How often have you had to urinate again less than two hours after you finished urinating? 2 - Less than half the time  3)  How often have you found you stopped and started again several times when you urinated?  0 - Not at all  4) How difficult have you found it to postpone urination?  1 - Less than 1 time in 5  5) How often have you had a weak urinary stream?  0 - Not at all  6) How often have you had to push or strain to begin urination?  0 - Not at all  7) How many times did you most typically get up to urinate from the time you went to bed until the time you got up in the morning?  2 - 2 times  Total score:  4   Aspirin: not indicated ECG: not indicated  Vaccinations: PNA vaccine today  Advanced Care Planning: A voluntary discussion about advance care planning including the explanation and discussion of advance directives.  Discussed health care proxy and Living will, and the patient DOES have a living will at  present time. If patient does have living will, I have requested they bring this to the clinic to be scanned in to their chart.  Patient Active Problem List   Diagnosis Date Noted  . BPH (benign prostatic hyperplasia) 08/20/2017  . Hyperlipidemia 08/20/2017  . Diverticulitis 08/20/2017  . GERD (gastroesophageal reflux disease) 08/20/2017  . Hypertension 08/20/2017  . Alcohol use 08/20/2017  . PTSD (post-traumatic stress disorder) 08/20/2017    Past Surgical History:  Procedure Laterality Date  . HERNIA REPAIR    . TONSILLECTOMY AND ADENOIDECTOMY      Family History  Problem Relation Age of Onset  . Diabetes Mother   . Heart attack Mother   . Miscarriages / [place] Mother    . Alcohol abuse Father   . Early death Father   . Hearing loss Father   . Heart disease Father   . Hyperlipidemia Father   . Hypertension Father   . Alcohol abuse Brother   . Alcohol abuse Maternal Grandmother   . Diabetes Maternal Grandmother   . Hearing loss Maternal Grandmother   . Heart disease Maternal Grandmother   . Hearing loss Maternal Grandfather   . Heart disease Maternal Grandfather   . Heart disease Paternal Grandmother   . Heart disease Paternal Grandfather     Social History   Socioeconomic History  . Marital status: Married    Spouse name: Not on file  . Number of children: Not on file  . Years of education: Not on file  . Highest education level: Not on file  Occupational History  . Not on file  Social Needs  . Financial resource strain: Not on file  . Food insecurity:    Worry: Not on file    Inability: Not on file  . Transportation needs:    Medical: Not on file    Non-medical: Not on file  Tobacco Use  . Smoking status: Former Research scientist (life sciences)  . Smokeless tobacco: Never Used  Substance and Sexual Activity  . Alcohol use: Yes    Comment: daily  . Drug use: No  . Sexual activity: Yes    Partners: Female  Lifestyle  . Physical activity:    Days per week: Not on file    Minutes per session: Not on file  . Stress: Not on file  Relationships  . Social connections:    Talks on phone: Not on file    Gets together: Not on file    Attends religious service: Not on file    Active member of club or organization: Not on file    Attends meetings of clubs or organizations: Not on file    Relationship status: Not on file  . Intimate partner violence:    Fear of current or ex partner: Not on file    Emotionally abused: Not on file    Physically abused: Not on file    Forced sexual activity: Not on file  Other Topics Concern  . Not on file  Social History Narrative   Fun- retired, just moved to area.   Neighborhood clubs     Current Outpatient  Medications:  Marland Kitchen  Methylcellulose, Laxative, (FIBER THERAPY PO), Take by mouth., Disp: , Rfl:  .  NETTLE-PYGEUM AFRICANUM PO, Take by mouth., Disp: , Rfl:  .  prazosin (MINIPRESS) 5 MG capsule, Take 1 capsule (5 mg total) by mouth 2 (two) times daily., Disp: 180 capsule, Rfl: 2 .  predniSONE (DELTASONE) 20 MG tablet, Two daily with food, Disp: 10 tablet,  Rfl: 0 .  saw palmetto 500 MG capsule, Take 500 mg by mouth daily., Disp: , Rfl:  .  sertraline (ZOLOFT) 100 MG tablet, Take 1 tablet (100 mg total) by mouth daily., Disp: 90 tablet, Rfl: 2 .  simvastatin (ZOCOR) 20 MG tablet, Take 1 tablet (20 mg total) by mouth daily., Disp: 90 tablet, Rfl: 2  No Known Allergies   ROS  Constitutional: Negative for fever or weight change.  Respiratory: Negative for cough and shortness of breath.   Cardiovascular: Negative for chest pain or palpitations.  Gastrointestinal: Negative for abdominal pain, no bowel changes.  Musculoskeletal: Negative for gait problem or joint swelling.  Skin: Negative for rash.  Neurological: Negative for dizziness or headache.  No other specific complaints in a complete review of systems (except as listed in HPI above).   Objective  Vitals:   01/16/18 0800  BP: 124/80  Pulse: 69  Resp: 16  Temp: 97.8 F (36.6 C)  TempSrc: Oral  SpO2: 96%  Weight: 208 lb (94.3 kg)  Height: 5\' 10"  (1.778 m)    Body mass index is 29.84 kg/m.  Physical Exam Vital signs reviewed. Constitutional: Patient appears well-developed and well-nourished. No distress.  HENT: Head: Normocephalic and atraumatic. Ears: B TMs ok, no erythema or effusion; Nose: Nose normal. Mouth/Throat: Oropharynx is clear and moist. No oropharyngeal exudate.  Eyes: Conjunctivae and EOM are normal. Pupils are equal, round, and reactive to light. No scleral icterus.  Neck: Normal range of motion. Neck supple. No carotid bruit. No thyromegaly present.  Cardiovascular: Normal rate, regular rhythm and normal  heart sounds.  No murmur heard. No BLE edema. Pulmonary/Chest: Effort normal and breath sounds normal. No respiratory distress. Abdominal: Soft. Bowel sounds are normal, no distension. There is no tenderness. no masses Musculoskeletal: Normal range of motion, no joint effusions. No gross deformities Neurological: he is alert and oriented to person, place, and time. No cranial nerve deficit. Coordination, balance, strength, speech and gait are normal.  Skin: Skin is warm and dry. No rash noted. No erythema.  Psychiatric: Patient has a normal mood and affect. behavior is normal. Judgment and thought content normal.   Assessment & Plan RTC in 1 year for CPE

## 2018-01-17 LAB — HEPATITIS C ANTIBODY
HEP C AB: NONREACTIVE
SIGNAL TO CUT-OFF: 0.01 (ref <1.00)

## 2018-01-19 ENCOUNTER — Encounter: Payer: Self-pay | Admitting: Nurse Practitioner

## 2018-01-19 DIAGNOSIS — R7303 Prediabetes: Secondary | ICD-10-CM | POA: Insufficient documentation

## 2018-01-22 ENCOUNTER — Telehealth: Payer: Self-pay | Admitting: Nurse Practitioner

## 2018-01-22 NOTE — Telephone Encounter (Signed)
Copied from [place] [id number]. Topic: Quick Communication - See Telephone Encounter >> Jan 22, 2018 10:26 AM Bea Graff, NT wrote: CRM for notification. See Telephone encounter for: 01/22/18. Pt calling back to get lab results.

## 2018-01-23 NOTE — Telephone Encounter (Signed)
Left message for patient to call back to review lab results- see result basket

## 2018-04-08 IMAGING — DX DG CHEST 2V
2 series · 2 of 2 positions shown · non-contrast
Comparison: None.

CLINICAL DATA: 66-year-old male with productive cough

EXAM:
CHEST  2 VIEW

[chest pa]
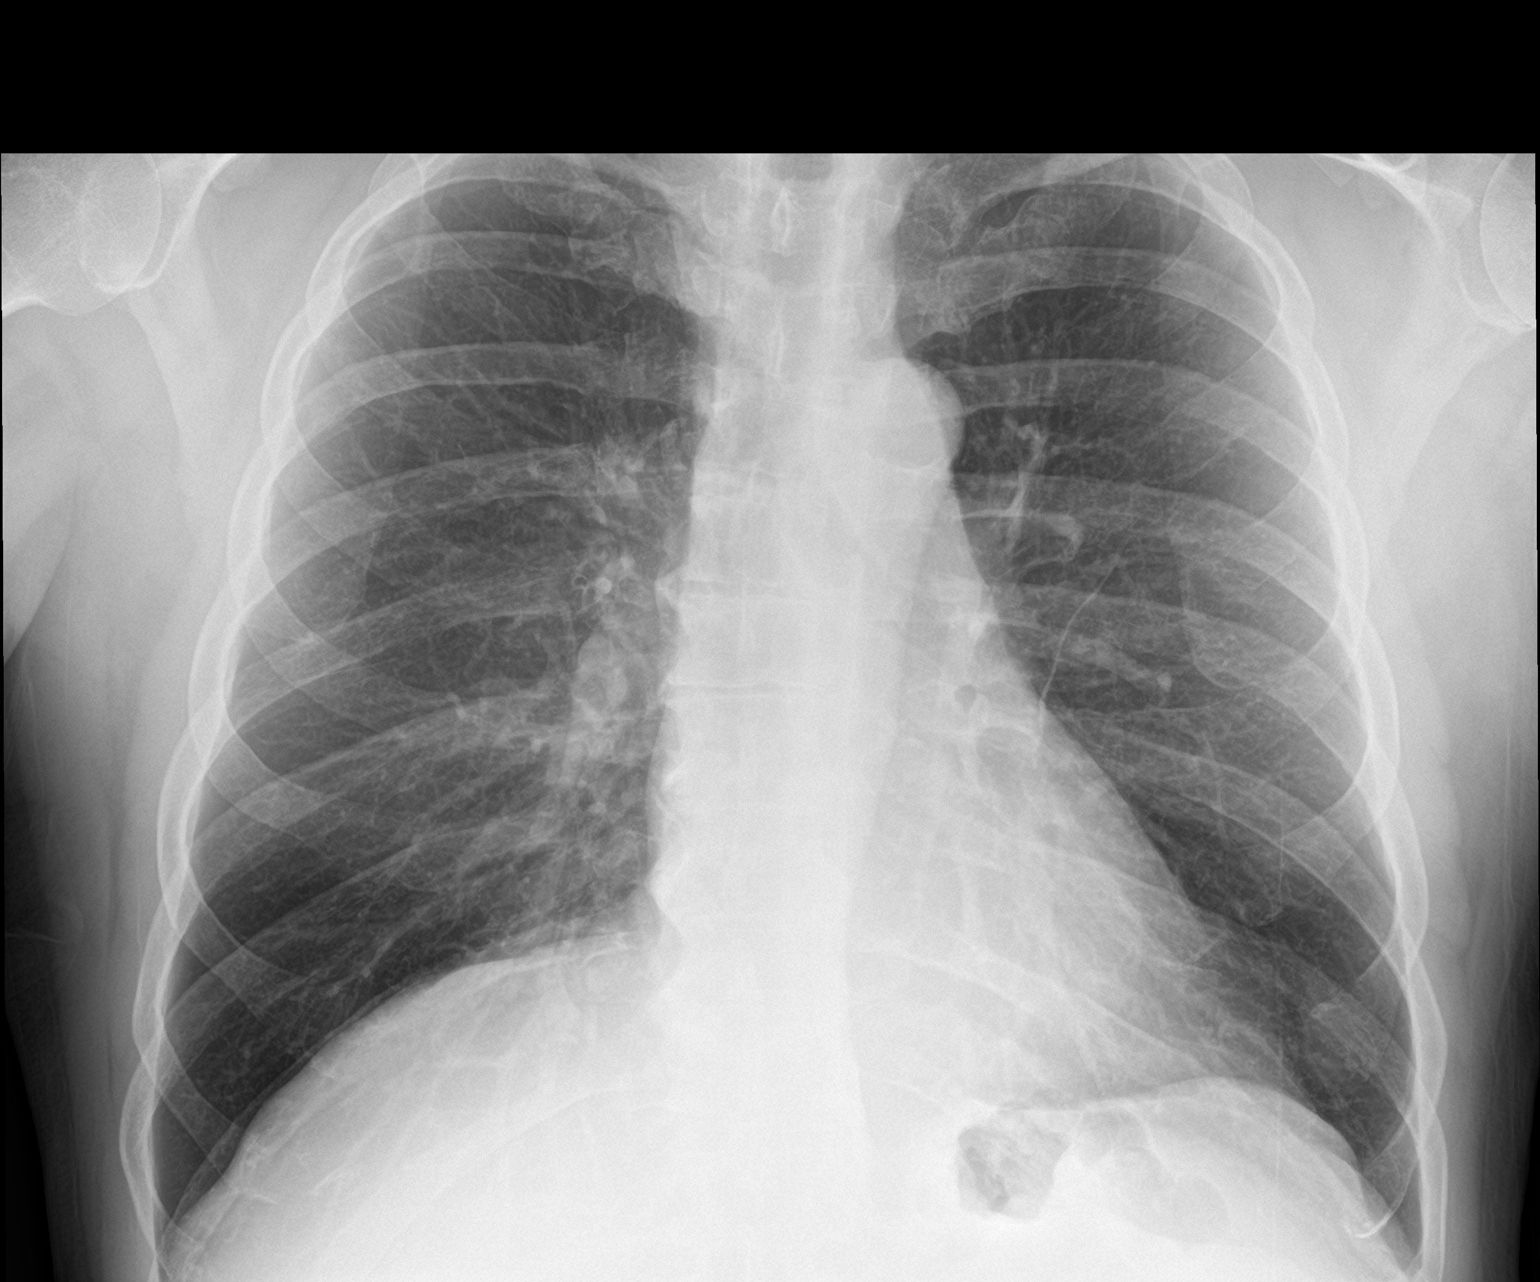

[chest lat]
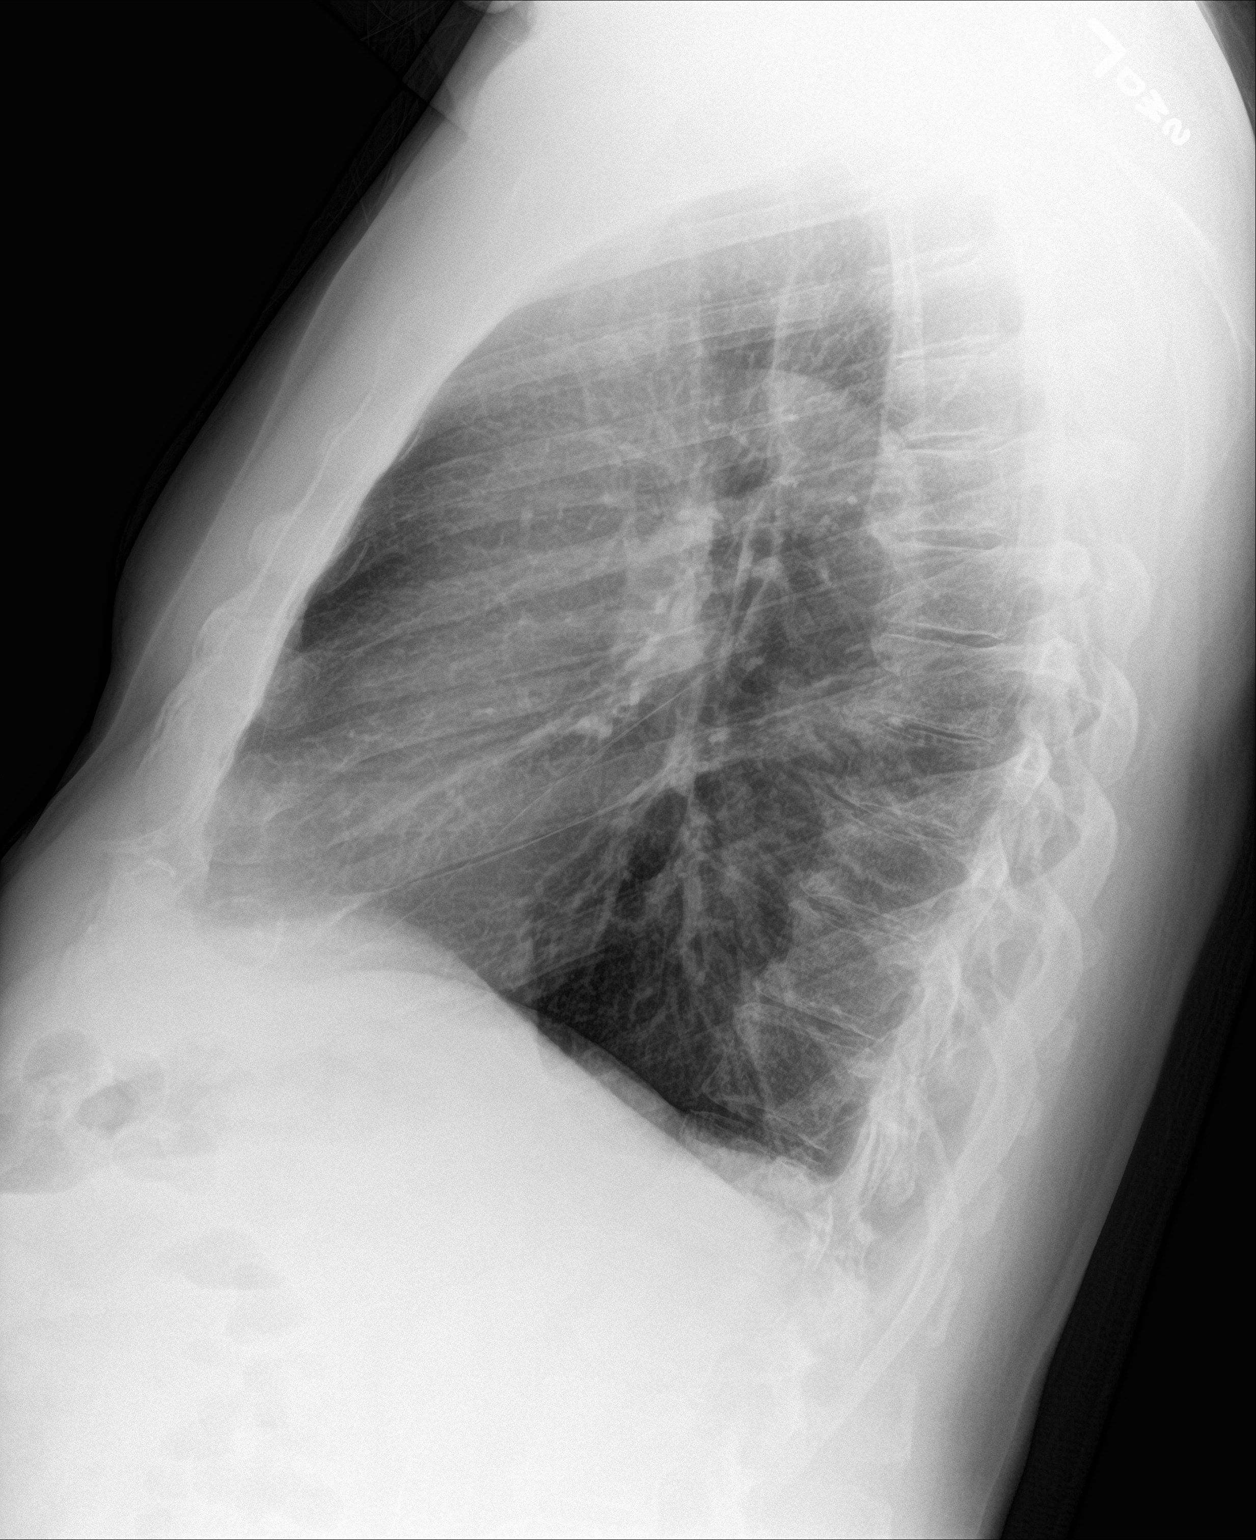

[2 of 2 positions shown; findings below may reference images not displayed]

FINDINGS: The lungs are clear and negative for focal airspace consolidation,
pulmonary edema or suspicious pulmonary nodule. No pleural effusion
or pneumothorax. Cardiac and mediastinal contours are within normal
limits. No acute fracture or lytic or blastic osseous lesions. The
visualized upper abdominal bowel gas pattern is unremarkable.
IMPRESSION: Negative chest x-ray.

## 2018-10-17 ENCOUNTER — Other Ambulatory Visit: Payer: Self-pay | Admitting: Nurse Practitioner

## 2018-10-17 DIAGNOSIS — I10 Essential (primary) hypertension: Secondary | ICD-10-CM

## 2018-10-17 DIAGNOSIS — N400 Enlarged prostate without lower urinary tract symptoms: Secondary | ICD-10-CM

## 2019-01-21 ENCOUNTER — Other Ambulatory Visit: Payer: Self-pay | Admitting: *Deleted

## 2019-01-21 DIAGNOSIS — F4310 Post-traumatic stress disorder, unspecified: Secondary | ICD-10-CM

## 2019-01-21 DIAGNOSIS — N400 Enlarged prostate without lower urinary tract symptoms: Secondary | ICD-10-CM

## 2019-01-21 DIAGNOSIS — I10 Essential (primary) hypertension: Secondary | ICD-10-CM

## 2019-01-21 MED ORDER — PRAZOSIN HCL 5 MG PO CAPS: 5.0000 mg | ORAL_CAPSULE | Freq: Two times a day (BID) | ORAL | 0 refills | Status: DC

## 2019-01-21 MED ORDER — SERTRALINE HCL 100 MG PO TABS: 100.0000 mg | ORAL_TABLET | Freq: Every day | ORAL | 0 refills | Status: DC

## 2019-01-21 NOTE — Addendum Note (Signed)
Addended by: Cresenciano Lick on: 01/21/2019 10:05 AM   Modules accepted: Orders

## 2019-01-22 ENCOUNTER — Other Ambulatory Visit: Payer: Self-pay | Admitting: *Deleted

## 2019-01-22 DIAGNOSIS — E785 Hyperlipidemia, unspecified: Secondary | ICD-10-CM

## 2019-01-22 MED ORDER — SIMVASTATIN 20 MG PO TABS
20.0000 mg | ORAL_TABLET | Freq: Every day | ORAL | 0 refills | Status: DC
Start: 2019-01-22 — End: 2019-07-11

## 2019-03-04 ENCOUNTER — Other Ambulatory Visit: Payer: Self-pay

## 2019-03-04 ENCOUNTER — Encounter: Payer: Self-pay | Admitting: Family

## 2019-03-04 ENCOUNTER — Other Ambulatory Visit (INDEPENDENT_AMBULATORY_CARE_PROVIDER_SITE_OTHER): Payer: Medicare Other

## 2019-03-04 ENCOUNTER — Other Ambulatory Visit: Payer: Self-pay | Admitting: Family

## 2019-03-04 ENCOUNTER — Ambulatory Visit (INDEPENDENT_AMBULATORY_CARE_PROVIDER_SITE_OTHER): Payer: Medicare Other | Admitting: Family

## 2019-03-04 VITALS — BP 124/80 | HR 79 | Temp 98.0°F | Ht 70.0 in | Wt 200.4 lb

## 2019-03-04 DIAGNOSIS — L989 Disorder of the skin and subcutaneous tissue, unspecified: Secondary | ICD-10-CM

## 2019-03-04 DIAGNOSIS — R7303 Prediabetes: Secondary | ICD-10-CM

## 2019-03-04 DIAGNOSIS — R2689 Other abnormalities of gait and mobility: Secondary | ICD-10-CM | POA: Diagnosis not present

## 2019-03-04 DIAGNOSIS — N400 Enlarged prostate without lower urinary tract symptoms: Secondary | ICD-10-CM

## 2019-03-04 DIAGNOSIS — E785 Hyperlipidemia, unspecified: Secondary | ICD-10-CM

## 2019-03-04 DIAGNOSIS — Z23 Encounter for immunization: Secondary | ICD-10-CM | POA: Diagnosis not present

## 2019-03-04 DIAGNOSIS — I10 Essential (primary) hypertension: Secondary | ICD-10-CM

## 2019-03-04 DIAGNOSIS — E559 Vitamin D deficiency, unspecified: Secondary | ICD-10-CM

## 2019-03-04 DIAGNOSIS — F4310 Post-traumatic stress disorder, unspecified: Secondary | ICD-10-CM | POA: Diagnosis not present

## 2019-03-04 DIAGNOSIS — R972 Elevated prostate specific antigen [PSA]: Secondary | ICD-10-CM | POA: Diagnosis not present

## 2019-03-04 DIAGNOSIS — M6530 Trigger finger, unspecified finger: Secondary | ICD-10-CM | POA: Diagnosis not present

## 2019-03-04 LAB — COMPREHENSIVE METABOLIC PANEL
ALT: 16 U/L (ref 0–53)
AST: 14 U/L (ref 0–37)
Albumin: 4.4 g/dL (ref 3.5–5.2)
Alkaline Phosphatase: 57 U/L (ref 39–117)
BUN: 24 mg/dL — ABNORMAL HIGH (ref 6–23)
CO2: 26 mEq/L (ref 19–32)
Calcium: 10.5 mg/dL (ref 8.4–10.5)
Chloride: 106 mEq/L (ref 96–112)
Creatinine, Ser: 1.18 mg/dL (ref 0.40–1.50)
GFR: 61.65 mL/min (ref >60.00)
Glucose, Bld: 100 mg/dL — ABNORMAL HIGH (ref 70–99)
Potassium: 4.8 mEq/L (ref 3.5–5.1)
Sodium: 138 mEq/L (ref 135–145)
Total Bilirubin: 1.1 mg/dL (ref 0.2–1.2)
Total Protein: 7.3 g/dL (ref 6.0–8.3)

## 2019-03-04 LAB — CBC WITH DIFFERENTIAL/PLATELET
Basophils Absolute: 0 10*3/uL (ref 0.0–0.1)
Basophils Relative: 0.4 % (ref 0.0–3.0)
Eosinophils Absolute: 0.1 10*3/uL (ref 0.0–0.7)
Eosinophils Relative: 1 % (ref 0.0–5.0)
HCT: 42 % (ref 39.0–52.0)
Hemoglobin: 14.6 g/dL (ref 13.0–17.0)
Lymphocytes Relative: 29.2 % (ref 12.0–46.0)
Lymphs Abs: 1.9 10*3/uL (ref 0.7–4.0)
MCHC: 34.8 g/dL (ref 30.0–36.0)
MCV: 89.7 fl (ref 78.0–100.0)
Monocytes Absolute: 0.4 10*3/uL (ref 0.1–1.0)
Monocytes Relative: 5.6 % (ref 3.0–12.0)
Neutro Abs: 4.2 10*3/uL (ref 1.4–7.7)
Neutrophils Relative %: 63.8 % (ref 43.0–77.0)
Platelets: 175 10*3/uL (ref 150.0–400.0)
RBC: 4.69 Mil/uL (ref 4.22–5.81)
RDW: 13 % (ref 11.5–15.5)
WBC: 6.5 10*3/uL (ref 4.0–10.5)

## 2019-03-04 LAB — LIPID PANEL
Cholesterol: 176 mg/dL (ref 0–200)
HDL: 36 mg/dL — ABNORMAL LOW (ref >39.00)
LDL Cholesterol: 118 mg/dL — ABNORMAL HIGH (ref 0–99)
NonHDL: 139.81
Total CHOL/HDL Ratio: 5
Triglycerides: 107 mg/dL (ref 0.0–149.0)
VLDL: 21.4 mg/dL (ref 0.0–40.0)

## 2019-03-04 LAB — HEMOGLOBIN A1C: Hgb A1c MFr Bld: 6.3 % (ref 4.6–6.5)

## 2019-03-04 LAB — VITAMIN D 25 HYDROXY (VIT D DEFICIENCY, FRACTURES): VITD: 31.68 ng/mL (ref 30.00–100.00)

## 2019-03-04 LAB — PSA: PSA: 7.66 ng/mL — ABNORMAL HIGH (ref 0.10–4.00)

## 2019-03-04 MED ORDER — SERTRALINE HCL 100 MG PO TABS: 100.0000 mg | ORAL_TABLET | Freq: Every day | ORAL | 3 refills | Status: DC

## 2019-03-04 MED ORDER — PRAZOSIN HCL 5 MG PO CAPS: 5.0000 mg | ORAL_CAPSULE | Freq: Two times a day (BID) | ORAL | 3 refills | Status: DC

## 2019-03-04 NOTE — Progress Notes (Signed)
Matthew Walsh is a 68 y.o. male with the following history as recorded in Epic[employer]:  Patient Active Problem List   Diagnosis Date Noted  . Pre-diabetes 01/19/2018  . Encounter for general adult medical examination with abnormal findings 01/16/2018  . BPH (benign prostatic hyperplasia) 08/20/2017  . Hyperlipidemia 08/20/2017  . Diverticulitis 08/20/2017  . GERD (gastroesophageal reflux disease) 08/20/2017  . Hypertension 08/20/2017  . Alcohol use 08/20/2017  . PTSD (post-traumatic stress disorder) 08/20/2017    Current Outpatient Medications  Medication Sig Dispense Refill  . Methylcellulose, Laxative, (FIBER THERAPY PO) Take by mouth.    . NETTLE-PYGEUM AFRICANUM PO Take by mouth.    . prazosin (MINIPRESS) 5 MG capsule Take 1 capsule (5 mg total) by mouth 2 (two) times daily. 180 capsule 3  . saw palmetto 500 MG capsule Take 500 mg by mouth daily.    . sertraline (ZOLOFT) 100 MG tablet Take 1 tablet (100 mg total) by mouth daily. 90 tablet 3  . simvastatin (ZOCOR) 20 MG tablet Take 1 tablet (20 mg total) by mouth daily. Must keep 03/04/19 visit for future refills. 90 tablet 0   No current facility-administered medications for this visit.     Allergies: Patient has no known allergies.  Past Medical History:  Diagnosis Date  . Asthma    As a child  . Diverticulitis   . GERD (gastroesophageal reflux disease)   . Hypertension     Past Surgical History:  Procedure Laterality Date  . HERNIA REPAIR    . TONSILLECTOMY AND ADENOIDECTOMY      Family History  Problem Relation Age of Onset  . Diabetes Mother   . Heart attack Mother   . Miscarriages / [place] Mother   . Alcohol abuse Father   . Early death Father   . Hearing loss Father   . Heart disease Father   . Hyperlipidemia Father   . Hypertension Father   . Alcohol abuse Brother   . Alcohol abuse Maternal Grandmother   . Diabetes Maternal Grandmother   . Hearing loss Maternal Grandmother   . Heart disease  Maternal Grandmother   . Hearing loss Maternal Grandfather   . Heart disease Maternal Grandfather   . Heart disease Paternal Grandmother   . Heart disease Paternal Grandfather     Social History   Tobacco Use  . Smoking status: Former Research scientist (life sciences)  . Smokeless tobacco: Never Used  Substance Use Topics  . Alcohol use: Yes    Comment: daily    Subjective:  Patient presents to follow up for yearly check up; in baseline state of health with no concerns;  Wants to stop his statin- feels like he is working on diet and exercise; feels like he is doing well since moving to [place] approximately 1 year ago; Known BPH/ elevated PSA- does not want to go back to urology for follow-up; agreeable to regular checks of his PSA Up to date on dental and vision exams; Tries to exercise regularly; Has quit drinking completely in the past year- "feels great."     Objective:  Vitals:   03/04/19 0928  BP: 124/80  Pulse: 79  Temp: 98 F (36.7 C)  TempSrc: Oral  SpO2: 98%  Weight: 200 lb 6.4 oz (90.9 kg)  Height: 5' 10"  (1.778 m)    General: Well developed, well nourished, in no acute distress  Skin : Warm and dry.  Head: Normocephalic and atraumatic  Eyes: Sclera and conjunctiva clear; pupils round and reactive to light;  extraocular movements intact  Ears: External normal; canals clear; tympanic membranes normal  Oropharynx: Pink, supple. No suspicious lesions  Neck: Supple without thyromegaly, adenopathy  Lungs: Respirations unlabored; clear to auscultation bilaterally without wheeze, rales, rhonchi  CVS exam: normal rate and regular rhythm.  Abdomen: Soft; nontender; nondistended; normoactive bowel sounds; no masses or hepatosplenomegaly  Musculoskeletal: No deformities; no active joint inflammation  Extremities: No edema, cyanosis, clubbing  Vessels: Symmetric bilaterally  Neurologic: Alert and oriented; speech intact; face symmetrical; moves all extremities well; CNII-XII intact  without focal deficit   Assessment:  1. Essential hypertension   2. PTSD (post-traumatic stress disorder)   3. Trigger finger of right hand, unspecified finger   4. Skin lesion   5. Hyperlipidemia, unspecified hyperlipidemia type   6. Pre-diabetes   7. Vitamin D deficiency   8. Elevated PSA   9. Balance problem   10. Hypertension, unspecified type   11. Benign prostatic hyperplasia, unspecified whether lower urinary tract symptoms present     Plan:  1. Stable; refills updated; check CBC, CMP; 2. Stable; refill on Zoloft; 3. Refer to hand specialist for treatment; 4. History of possible skin cancer; refer to dermatology for "head to toe" check/ establish care; 5. Check lipid panel today; he will hold his Simvastatin for the next 3-4 months; plan to check labs again later this year to determine if he needs to re-start medication. 6. Check Hgba1c; 7. Check Vitamin D level; 8. Check PSA; patient does not want to go back to urology at this time; 9. Refer to PT for further evaluation; 10. Stable; refills updated; 11. Stable; refills updated;  Spent 50+ minutes with patient today; greater than 50% in counseling/ discussing healthcare needs.   No follow-ups on file.  Orders Placed This Encounter  Procedures  . Pneumococcal polysaccharide vaccine 23-valent greater than or equal to 2yo subcutaneous/IM  . CBC w/Diff    Standing Status:   Future    Number of Occurrences:   1    Standing Expiration Date:   03/03/2020  . Comp Met (CMET)    Standing Status:   Future    Number of Occurrences:   1    Standing Expiration Date:   03/03/2020  . Lipid panel    Standing Status:   Future    Number of Occurrences:   1    Standing Expiration Date:   03/03/2020  . PSA    Standing Status:   Future    Number of Occurrences:   1    Standing Expiration Date:   03/03/2020  . HgB A1c    Standing Status:   Future    Number of Occurrences:   1    Standing Expiration Date:   03/03/2020  . Vitamin D (25  hydroxy)    Standing Status:   Future    Number of Occurrences:   1    Standing Expiration Date:   03/03/2020  . Ambulatory referral to Orthopedic Surgery    Referral Priority:   Routine    Referral Type:   Surgical    Referral Reason:   Specialty Services Required    Requested Specialty:   Orthopedic Surgery    Number of Visits Requested:   1  . Ambulatory referral to Dermatology    Referral Priority:   Routine    Referral Type:   Consultation    Referral Reason:   Specialty Services Required    Requested Specialty:   Dermatology    Number of  Visits Requested:   1  . Ambulatory referral to Physical Therapy    Referral Priority:   Routine    Referral Type:   Physical Medicine    Referral Reason:   Specialty Services Required    Requested Specialty:   Physical Therapy    Number of Visits Requested:   1    Requested Prescriptions   Signed Prescriptions Disp Refills  . sertraline (ZOLOFT) 100 MG tablet 90 tablet 3    Sig: Take 1 tablet (100 mg total) by mouth daily.  . prazosin (MINIPRESS) 5 MG capsule 180 capsule 3    Sig: Take 1 capsule (5 mg total) by mouth 2 (two) times daily.

## 2019-03-19 ENCOUNTER — Encounter: Payer: Self-pay | Admitting: Orthopaedic Surgery

## 2019-03-19 ENCOUNTER — Ambulatory Visit: Payer: Medicare Other | Attending: Family | Admitting: Physical Therapy

## 2019-03-19 ENCOUNTER — Other Ambulatory Visit: Payer: Self-pay

## 2019-03-19 ENCOUNTER — Encounter: Payer: Self-pay | Admitting: Physical Therapy

## 2019-03-19 ENCOUNTER — Ambulatory Visit (INDEPENDENT_AMBULATORY_CARE_PROVIDER_SITE_OTHER): Payer: Medicare Other | Admitting: Orthopaedic Surgery

## 2019-03-19 DIAGNOSIS — M65331 Trigger finger, right middle finger: Secondary | ICD-10-CM

## 2019-03-19 DIAGNOSIS — R2689 Other abnormalities of gait and mobility: Secondary | ICD-10-CM | POA: Diagnosis not present

## 2019-03-19 MED ORDER — METHYLPREDNISOLONE ACETATE 40 MG/ML IJ SUSP
13.3300 mg | INTRAMUSCULAR | Status: AC | PRN
  Administered 2019-03-19: 13.33 mg

## 2019-03-19 MED ORDER — LIDOCAINE HCL 1 % IJ SOLN
1.0000 mL | INTRAMUSCULAR | Status: AC | PRN
  Administered 2019-03-19: 1 mL

## 2019-03-19 MED ORDER — BUPIVACAINE HCL 0.25 % IJ SOLN
0.3300 mL | INTRAMUSCULAR | Status: AC | PRN
  Administered 2019-03-19: .33 mL

## 2019-03-19 NOTE — Patient Instructions (Signed)
Access Code: [email or web address]  URL: [email or web address]  Date: 03/19/2019  Prepared by: Elsie Ra   Exercises  Walking Tandem Stance - 10 reps - 3 sets - 2x daily - 6x weekly  Heel Walking - 10 reps - 3 sets - 2x daily - 6x weekly  Toe Walking - 10 reps - 3 sets - 2x daily - 6x weekly  Walking with Head Rotation - 10 reps - 3 sets - 2x daily - 6x weekly  Walking with Head Nod - 10 reps - 3 sets - 2x daily - 6x weekly  Single Leg Stance - 5 reps - 1 sets - 20 sec hold - 2x daily - 6x weekly  Single Leg Balance with Clock Reach - 10 reps - 3 sets - 2x daily - 6x weekly  Tandem Stance with Eyes Closed - 10 reps - 3 sets - 2x daily - 6x weekly

## 2019-03-19 NOTE — Progress Notes (Signed)
Office Visit Note   Patient: Matthew Walsh           Date of Birth: May 24, 1951           MRN: [id number] Visit Date: 03/19/2019              Requested by: [name], [place],  [place] [address] PCP: [name], FNP   Assessment & Plan: Visit Diagnoses:  1. Trigger finger, right middle finger     Plan: Impression is right long trigger finger.  We will inject this with cortisone today.  He will follow-up with [place] as needed.  Call with concerns or questions in the meantime.  Follow-Up Instructions: Return if symptoms worsen or fail to improve.   Orders:  Orders Placed This Encounter  Procedures   Hand/UE Inj: R long A1   No orders of the defined types were placed in this encounter.     Procedures: Hand/UE Inj: R long A1 for trigger finger on 03/19/2019 10:56 AM Indications: pain Details: 25 G needle Medications: 0.33 mL bupivacaine 0.25 %; 1 mL lidocaine 1 %; 13.33 mg methylPREDNISolone acetate 40 MG/ML      Clinical Data: No additional findings.   Subjective: Chief Complaint  Patient presents with   Right Hand - Pain    HPI patient is a pleasant 68 year old right-hand-dominant gentleman who presents our clinic today with triggering to the right long finger.  He noticed this a few months ago.  No known injury or change in activity.  No previous history of trigger finger.  No numbness, tingling or burning.  No previous cortisone injection.  Review of Systems as detailed in HPI.  All others reviewed and are negative.   Objective: Vital Signs: There were no vitals taken for this visit.  Physical Exam well-developed and well-nourished gentleman in no acute distress.  Alert and oriented x3.  Ortho Exam examination of his right long finger reveals tenderness to the A1 pulley.  He he is not currently exhibiting any active triggering.  No tenderness to the DIP or PIP joints.  He has slightly limited flexion of the long  finger.  He is neurovascular intact distally.  Specialty Comments:  No specialty comments available.  Imaging: No new imaging   PMFS History: Patient Active Problem List   Diagnosis Date Noted   Pre-diabetes 01/19/2018   Encounter for general adult medical examination with abnormal findings 01/16/2018   BPH (benign prostatic hyperplasia) 08/20/2017   Hyperlipidemia 08/20/2017   Diverticulitis 08/20/2017   GERD (gastroesophageal reflux disease) 08/20/2017   Hypertension 08/20/2017   Alcohol use 08/20/2017   PTSD (post-traumatic stress disorder) 08/20/2017   Past Medical History:  Diagnosis Date   Asthma    As a child   Diverticulitis    GERD (gastroesophageal reflux disease)    Hypertension     Family History  Problem Relation Age of Onset   Diabetes Mother    Heart attack Mother    Miscarriages / [place] Mother    Alcohol abuse Father    Early death Father    Hearing loss Father    Heart disease Father    Hyperlipidemia Father    Hypertension Father    Alcohol abuse Brother    Alcohol abuse Maternal Grandmother    Diabetes Maternal Grandmother    Hearing loss Maternal Grandmother    Heart disease Maternal Grandmother    Hearing loss Maternal Grandfather    Heart disease Maternal Grandfather  Heart disease Paternal Grandmother    Heart disease Paternal Grandfather     Past Surgical History:  Procedure Laterality Date   HERNIA REPAIR     TONSILLECTOMY AND ADENOIDECTOMY     Social History   Occupational History   Not on file  Tobacco Use   Smoking status: Former Smoker   Smokeless tobacco: Never Used  Substance and Sexual Activity   Alcohol use: Yes    Comment: daily   Drug use: No   Sexual activity: Yes    Partners: Female

## 2019-03-19 NOTE — Therapy (Signed)
[place], [place], [address] Phone: [phone]   Fax:  [phone]  Physical Therapy Evaluation  Patient Details  Name: Matthew Walsh MRN: [id number] Date of Birth: 1951/01/14 Referring Provider (PT): [name], FNP   Encounter Date: 03/19/2019  PT End of Session - 03/19/19 1030    Visit Number  1    Number of Visits  10    Date for PT Re-Evaluation  04/30/19    Authorization Type  MCR, progress note at 10    PT Start Time  0927    PT Stop Time  1012    PT Time Calculation (min)  45 min    Activity Tolerance  Patient tolerated treatment well    Behavior During Therapy  [facility] for tasks assessed/performed       Past Medical History:  Diagnosis Date  . Asthma    As a child  . Diverticulitis   . GERD (gastroesophageal reflux disease)   . Hypertension     Past Surgical History:  Procedure Laterality Date  . HERNIA REPAIR    . TONSILLECTOMY AND ADENOIDECTOMY      There were no vitals filed for this visit.   Subjective Assessment - 03/19/19 1021    Subjective  Pt relays he has been having decreased balance this year, mostly if climbing ladder, stairs, or doing yardwork. He has had 2 falls in last 6 months, one on ladder and one leaning back on the stairs. He is losing confidence with his balance now.He is retired but stays active and tries to walk 3 miles a day. He denies any pain or difficulty with anything else and is indpendent with all ADL's.    Pertinent History  PMH:BPH,GERD,asthma,HTN, diverticulitis,PTSD    Patient Stated Goals  improve balance    Currently in Pain?  No/denies         [facility] PT Assessment - 03/19/19 0001      Assessment   Medical Diagnosis  Balance    Referring Provider (PT)  [name], FNP    Onset Date/Surgical Date  --   declining balance over a year   Next MD Visit  unsure    Prior Therapy  none      Precautions   Precautions   None      Restrictions   Weight Bearing Restrictions  No      Balance Screen   Has the patient fallen in the past 6 months  Yes    How many times?  2   off ladder, and down stairs, denies injury from these   Has the patient had a decrease in activity level because of a fear of falling?   No    Is the patient reluctant to leave their home because of a fear of falling?   No      Home Film/video editor residence      Prior Function   Level of Independence  Independent      Cognition   Overall Cognitive Status  Within Functional Limits for tasks assessed      Coordination   Gross Motor Movements are Fluid and Coordinated  Yes      Posture/Postural Control   Posture Comments  WFL      ROM / Strength   AROM / PROM / Strength  AROM;Strength      AROM   AROM Assessment Site  --    Right/Left Ankle  --  Strength   Overall Strength Comments  general LE strength 5/5      Ambulation/Gait   Gait Comments  WNL gait pattern and velocity      Standardized Balance Assessment   Standardized Balance Assessment  Berg Balance Test      Berg Balance Test   Sit to Stand  Able to stand without using hands and stabilize independently    Standing Unsupported  Able to stand safely 2 minutes    Sitting with Back Unsupported but Feet Supported on Floor or Stool  Able to sit safely and securely 2 minutes    Stand to Sit  Sits safely with minimal use of hands    Transfers  Able to transfer safely, minor use of hands    Standing Unsupported with Eyes Closed  Able to stand 10 seconds with supervision    Standing Unsupported with Feet Together  Able to place feet together independently and stand 1 minute safely    From Standing, Reach Forward with Outstretched Arm  Can reach confidently >25 cm (10")    From Standing Position, Pick up Object from Floor  Able to pick up shoe safely and easily    From Standing Position, Turn to Look Behind Over each Shoulder  Looks behind  from both sides and weight shifts well    Turn 360 Degrees  Able to turn 360 degrees safely in 4 seconds or less    Standing Unsupported, Alternately Place Feet on Step/Stool  Able to stand independently and safely and complete 8 steps in 20 seconds    Standing Unsupported, One Foot in Front  Able to plae foot ahead of the other independently and hold 30 seconds    Standing on One Leg  Able to lift leg independently and hold 5-10 seconds    Total Score  53      Functional Gait  Assessment   Gait assessed   Yes    Gait Level Surface  Walks 20 ft in less than 5.5 sec, no assistive devices, good speed, no evidence for imbalance, normal gait pattern, deviates no more than 6 in outside of the 12 in walkway width.    Change in Gait Speed  Able to smoothly change walking speed without loss of balance or gait deviation. Deviate no more than 6 in outside of the 12 in walkway width.    Gait with Horizontal Head Turns  Performs head turns smoothly with slight change in gait velocity (eg, minor disruption to smooth gait path), deviates 6-10 in outside 12 in walkway width, or uses an assistive device.    Gait with Vertical Head Turns  Performs task with slight change in gait velocity (eg, minor disruption to smooth gait path), deviates 6 - 10 in outside 12 in walkway width or uses assistive device    Gait and Pivot Turn  Pivot turns safely within 3 sec and stops quickly with no loss of balance.    Step Over Obstacle  Is able to step over 2 stacked shoe boxes taped together (9 in total height) without changing gait speed. No evidence of imbalance.    Gait with Narrow Base of Support  Ambulates 4-7 steps.    Gait with Eyes Closed  Walks 20 ft, no assistive devices, good speed, no evidence of imbalance, normal gait pattern, deviates no more than 6 in outside 12 in walkway width. Ambulates 20 ft in less than 7 sec.    Ambulating Backwards  Walks 20 ft, uses assistive  device, slower speed, mild gait deviations,  deviates 6-10 in outside 12 in walkway width.    Steps  Alternating feet, no rail.    Total Score  25                Objective measurements completed on examination: See above findings.              PT Education - 03/19/19 1028    Education Details  HEP, POC, general nutrition and exerice education    Person(s) Educated  Patient    Methods  Explanation;Demonstration;Verbal cues;Handout    Comprehension  Verbalized understanding          PT Long Term Goals - 03/19/19 1038      PT LONG TERM GOAL #1   Title  Pt will be I and compliant with HEP. (target for all goals 6 weeks 04/30/19)    Status  New      PT LONG TERM GOAL #2   Title  Pt will improve BERG balance test to at least 55 to show improved balance    Baseline  53    Status  New      PT LONG TERM GOAL #3   Title  Pt will improve FGA to at least 28 to show improved balance    Baseline  25             Plan - 03/19/19 1031    Clinical Impression Statement  Pt presents with mild balance defecits and he scored overall well on most of his balance tests. He does have slighlty more defecits on Lt LE compared to Rt. He has good overall strength but less stabilty in Lt ankle. He does not need AD and he should be able to improve his balance and confidence with PT.    Personal Factors and Comorbidities  Comorbidity 1;Comorbidity 2    Comorbidities  PMH:BPH,GERD,asthma,HTN, diverticulitis,PTSD    Examination-Activity Limitations  Stairs   yardwork on uneven surfaces   Examination-Participation Restrictions  Yard Work    Stability/Clinical Decision Making  Stable/Uncomplicated    Clinical Decision Making  Low    Rehab Potential  Excellent    PT Frequency  Other (comment)   1-2   PT Treatment/Interventions  Gait training;Stair training;Therapeutic activities;Therapeutic exercise;Balance training;Neuromuscular re-education    PT Next Visit Plan  review HEP, needs high level balance training and include  uneven surfaces and dynamic balance    PT Home Exercise Plan  mod tandem with EC, tandem walk, heel walk, toe walk, walking with head turns, SLS, SL clock       Patient will benefit from skilled therapeutic intervention in order to improve the following deficits and impairments:  Decreased balance  Visit Diagnosis: 1. Other abnormalities of gait and mobility        Problem List Patient Active Problem List   Diagnosis Date Noted  . Pre-diabetes 01/19/2018  . Encounter for general adult medical examination with abnormal findings 01/16/2018  . BPH (benign prostatic hyperplasia) 08/20/2017  . Hyperlipidemia 08/20/2017  . Diverticulitis 08/20/2017  . GERD (gastroesophageal reflux disease) 08/20/2017  . Hypertension 08/20/2017  . Alcohol use 08/20/2017  . PTSD (post-traumatic stress disorder) 08/20/2017    Silvestre Mesi 03/19/2019, 10:46 AM  [facility] [address] [place], [place], [address] Phone: [phone]   Fax:  [phone]  Name: Matthew Walsh MRN: [id number] Date of Birth: Aug 20, 1951

## 2019-03-26 ENCOUNTER — Other Ambulatory Visit: Payer: Self-pay

## 2019-03-26 ENCOUNTER — Ambulatory Visit: Payer: Medicare Other | Admitting: Physical Therapy

## 2019-03-26 DIAGNOSIS — R2689 Other abnormalities of gait and mobility: Secondary | ICD-10-CM | POA: Diagnosis not present

## 2019-03-27 NOTE — Therapy (Signed)
Matthew Walsh, [place], [address] Phone: [phone]   Fax:  [phone]  Physical Therapy Treatment  Patient Details  Name: Matthew Walsh MRN: [id number] Date of Birth: 07/28/1951 Referring Provider (PT): [name], FNP   Encounter Date: 03/26/2019  PT End of Session - 03/27/19 0841    Visit Number  2    Number of Visits  10    Date for PT Re-Evaluation  04/30/19    Authorization Type  MCR, progress note at 10    PT Start Time  1700    PT Stop Time  1745    PT Time Calculation (min)  45 min    Activity Tolerance  Patient tolerated treatment well    Behavior During Therapy  [facility] for tasks assessed/performed       Past Medical History:  Diagnosis Date  . Asthma    As a child  . Diverticulitis   . GERD (gastroesophageal reflux disease)   . Hypertension     Past Surgical History:  Procedure Laterality Date  . HERNIA REPAIR    . TONSILLECTOMY AND ADENOIDECTOMY      There were no vitals filed for this visit.  Subjective Assessment - 03/27/19 0840    Subjective  Pt relays he has lost weight with the general advice PT gave him on diet and exercise. He also says he has been performing his HEP and is improving but "still not there" with his balance    Pertinent History  PMH:BPH,GERD,asthma,HTN, diverticulitis,PTSD    Patient Stated Goals  improve balance    Currently in Pain?  No/denies       Therex and Neuro re-ed activities listed below:  Elliptical warm up L1 X 6 min wobble board A-P and lateral X 20 ea Bosu ball:  Balance feet apart 1 min  x2, squats X 15, step overs X 10 lateral airex balance: SLS 30 sec X 3 bilat, tandem stance 30 sec X 2 bilat, feet together with eyes closed 30 sec X 3 SLS with ball toss 2X10 bilat marching over hurdles single, then reciprocal, fwd and lateral up/down  X2 steam boats SLS with red band X 15 each direction    PT Long Term Goals - 03/19/19  1038      PT LONG TERM GOAL #1   Title  Pt will be I and compliant with HEP. (target for all goals 6 weeks 04/30/19)    Status  New      PT LONG TERM GOAL #2   Title  Pt will improve BERG balance test to at least 55 to show improved balance    Baseline  53    Status  New      PT LONG TERM GOAL #3   Title  Pt will improve FGA to at least 28 to show improved balance    Baseline  25            Plan - 03/27/19 0841    Clinical Impression Statement  He did show improvements in his balance from IE. Session focused on high level balance training and SL balance activiites adding in foam surfaces    Personal Factors and Comorbidities  Comorbidity 1;Comorbidity 2    Comorbidities  PMH:BPH,GERD,asthma,HTN, diverticulitis,PTSD    Examination-Activity Limitations  Stairs   yardwork on uneven surfaces   Examination-Participation Restrictions  Yard Work    Stability/Clinical Decision Making  Stable/Uncomplicated    Rehab Potential  Excellent  PT Frequency  Other (comment)   1-2   PT Treatment/Interventions  Gait training;Stair training;Therapeutic activities;Therapeutic exercise;Balance training;Neuromuscular re-education    PT Next Visit Plan  needs high level balance training and include uneven surfaces and dynamic balance, perturbations    PT Home Exercise Plan  mod tandem with EC, tandem walk, heel walk, toe walk, walking with head turns, SLS, SL clock       Patient will benefit from skilled therapeutic intervention in order to improve the following deficits and impairments:  Decreased balance  Visit Diagnosis: 1. Other abnormalities of gait and mobility        Problem List Patient Active Problem List   Diagnosis Date Noted  . Pre-diabetes 01/19/2018  . Encounter for general adult medical examination with abnormal findings 01/16/2018  . BPH (benign prostatic hyperplasia) 08/20/2017  . Hyperlipidemia 08/20/2017  . Diverticulitis 08/20/2017  . GERD (gastroesophageal reflux  disease) 08/20/2017  . Hypertension 08/20/2017  . Alcohol use 08/20/2017  . PTSD (post-traumatic stress disorder) 08/20/2017    Silvestre Mesi 03/27/2019, 8:46 AM  [facility] [address] [place], [place], [address] Phone: [phone]   Fax:  [phone]  Name: Matthew Walsh MRN: [id number] Date of Birth: [birth year]

## 2019-04-01 ENCOUNTER — Other Ambulatory Visit: Payer: Self-pay

## 2019-04-01 ENCOUNTER — Ambulatory Visit: Payer: Medicare Other | Admitting: Physical Therapy

## 2019-04-01 DIAGNOSIS — R2689 Other abnormalities of gait and mobility: Secondary | ICD-10-CM

## 2019-04-01 NOTE — Therapy (Signed)
[place], [place], [address] Phone: [phone]   Fax:  [phone]  Physical Therapy Treatment  Patient Details  Name: Matthew Walsh MRN: [id number] Date of Birth: November 30, 1950 Referring Provider (PT): [name], FNP   Encounter Date: 04/01/2019  PT End of Session - 04/01/19 1156    Visit Number  3    Number of Visits  10    Date for PT Re-Evaluation  04/30/19    Authorization Type  MCR, progress note at 10    PT Start Time  1027    PT Stop Time  1105    PT Time Calculation (min)  38 min    Activity Tolerance  Patient tolerated treatment well    Behavior During Therapy  [facility] for tasks assessed/performed       Past Medical History:  Diagnosis Date  . Asthma    As a child  . Diverticulitis   . GERD (gastroesophageal reflux disease)   . Hypertension     Past Surgical History:  Procedure Laterality Date  . HERNIA REPAIR    . TONSILLECTOMY AND ADENOIDECTOMY      There were no vitals filed for this visit.  Subjective Assessment - 04/01/19 1155    Subjective  He feels his balance is improving    Pertinent History  PMH:BPH,GERD,asthma,HTN, diverticulitis,PTSD    Patient Stated Goals  improve balance    Currently in Pain?  No/denies                       [facility] Adult PT Treatment/Exercise - 04/01/19 0001      Ambulation/Gait   Gait Comments  WNL gait pattern and velocity      Posture/Postural Control   Posture Comments  WFL       Therex and Neuro re-ed activities listed below:  Elliptical warm up L1 X 6 min wobble board A-P and lateral X 20 ea Bosu ball:  Balance feet apart 1 min  x 3 on ea on blue side and black side, then eyes closed on BOSU 10 sec X 5 airex balance with perturbations 20 times X 4 SLS with ball toss X15 bilat, then tandem stance with PT ball toss out wide each direction SLS on foam pad 30 sec X 3 ea    PT Long Term Goals - 03/19/19  1038      PT LONG TERM GOAL #1   Title  Pt will be I and compliant with HEP. (target for all goals 6 weeks 04/30/19)    Status  New      PT LONG TERM GOAL #2   Title  Pt will improve BERG balance test to at least 55 to show improved balance    Baseline  53    Status  New      PT LONG TERM GOAL #3   Title  Pt will improve FGA to at least 28 to show improved balance    Baseline  25            Plan - 04/01/19 1157    Clinical Impression Statement  Pt had improved performance with high level balance training and is making great progress thus far    Personal Factors and Comorbidities  Comorbidity 1;Comorbidity 2    Comorbidities  PMH:BPH,GERD,asthma,HTN, diverticulitis,PTSD    Examination-Activity Limitations  Stairs   yardwork on uneven surfaces   Examination-Participation Restrictions  Yard Work    [employer]  Stable/Uncomplicated    Rehab Potential  Excellent    PT Frequency  Other (comment)   1-2   PT Treatment/Interventions  Gait training;Stair training;Therapeutic activities;Therapeutic exercise;Balance training;Neuromuscular re-education    PT Next Visit Plan  needs high level balance training and include uneven surfaces and dynamic balance, perturbations    PT Home Exercise Plan  mod tandem with EC, tandem walk, heel walk, toe walk, walking with head turns, SLS, SL clock       Patient will benefit from skilled therapeutic intervention in order to improve the following deficits and impairments:  Decreased balance  Visit Diagnosis: 1. Other abnormalities of gait and mobility        Problem List Patient Active Problem List   Diagnosis Date Noted  . Pre-diabetes 01/19/2018  . Encounter for general adult medical examination with abnormal findings 01/16/2018  . BPH (benign prostatic hyperplasia) 08/20/2017  . Hyperlipidemia 08/20/2017  . Diverticulitis 08/20/2017  . GERD (gastroesophageal reflux disease) 08/20/2017  . Hypertension  08/20/2017  . Alcohol use 08/20/2017  . PTSD (post-traumatic stress disorder) 08/20/2017    Silvestre Mesi 04/01/2019, 11:58 AM  [facility] [address] [place], [place], [address] Phone: [phone]   Fax:  [phone]  Name: Dan Scearce MRN: [id number] Date of Birth: 22-Sep-1951

## 2019-04-08 ENCOUNTER — Other Ambulatory Visit: Payer: Self-pay

## 2019-04-08 ENCOUNTER — Ambulatory Visit: Payer: Medicare Other | Attending: Family | Admitting: Physical Therapy

## 2019-04-08 DIAGNOSIS — R2689 Other abnormalities of gait and mobility: Secondary | ICD-10-CM | POA: Insufficient documentation

## 2019-04-08 NOTE — Therapy (Signed)
[place], [place], [address] Phone: [phone]   Fax:  [phone]  Physical Therapy Treatment  Patient Details  Name: Matthew Walsh MRN: [id number] Date of Birth: Feb 26, 1951 Referring Provider (PT): [name], FNP   Encounter Date: 04/08/2019  PT End of Session - 04/08/19 1116    Visit Number  4    Number of Visits  10    Date for PT Re-Evaluation  04/30/19    Authorization Type  MCR, progress note at 10    PT Start Time  1018    PT Stop Time  1100    PT Time Calculation (min)  42 min    Activity Tolerance  Patient tolerated treatment well    Behavior During Therapy  [facility] for tasks assessed/performed       Past Medical History:  Diagnosis Date  . Asthma    As a child  . Diverticulitis   . GERD (gastroesophageal reflux disease)   . Hypertension     Past Surgical History:  Procedure Laterality Date  . HERNIA REPAIR    . TONSILLECTOMY AND ADENOIDECTOMY      There were no vitals filed for this visit.  Subjective Assessment - 04/08/19 1115    Subjective  He says he feels more confident in his balance    Pertinent History  PMH:BPH,GERD,asthma,HTN, diverticulitis,PTSD    Patient Stated Goals  improve balance    Currently in Pain?  No/denies       Therex and neuromuscular Re-ed performed today  Nu step warm up L6  X6 min Bosu balance both sides, feet narrow with head turns, bosu step overs X 10 lateral SLS on dynadisk 3 reps each side airex SLS, tandem stance SLS with ball toss  X20 each side Balance obstacle course of bosu, rocker board, foam pads, up/down X 4 gastroc-soleus stretching 30 sec X 2 bilat   PT Long Term Goals - 04/08/19 1118      PT LONG TERM GOAL #1   Title  Pt will be I and compliant with HEP. (target for all goals 6 weeks 04/30/19)    Status  On-going      PT LONG TERM GOAL #2   Title  Pt will improve BERG balance test to at least 55 to show  improved balance    Baseline  53    Status  On-going      PT LONG TERM GOAL #3   Title  Pt will improve FGA to at least 28 to show improved balance    Baseline  25    Status  On-going            Plan - 04/08/19 1116    Clinical Impression Statement  He was progressed with higher level balance training today with good tolerance. He does have some signs of plantar fasciitis so was given some home stretches and home STM to perform for this as well as recommended night splint to him    Personal Factors and Comorbidities  Comorbidity 1;Comorbidity 2    Comorbidities  PMH:BPH,GERD,asthma,HTN, diverticulitis,PTSD    Examination-Activity Limitations  Stairs   yardwork on uneven surfaces   Examination-Participation Restrictions  Yard Work    Stability/Clinical Decision Making  Stable/Uncomplicated    Rehab Potential  Excellent    PT Frequency  Other (comment)   1-2   PT Treatment/Interventions  Gait training;Stair training;Therapeutic activities;Therapeutic exercise;Balance training;Neuromuscular re-education    PT Next Visit Plan  needs high  level balance training and include uneven surfaces and dynamic balance, perturbations    PT Home Exercise Plan  mod tandem with EC, tandem walk, heel walk, toe walk, walking with head turns, SLS, SL clock       Patient will benefit from skilled therapeutic intervention in order to improve the following deficits and impairments:  Decreased balance  Visit Diagnosis: 1. Other abnormalities of gait and mobility        Problem List Patient Active Problem List   Diagnosis Date Noted  . Pre-diabetes 01/19/2018  . Encounter for general adult medical examination with abnormal findings 01/16/2018  . BPH (benign prostatic hyperplasia) 08/20/2017  . Hyperlipidemia 08/20/2017  . Diverticulitis 08/20/2017  . GERD (gastroesophageal reflux disease) 08/20/2017  . Hypertension 08/20/2017  . Alcohol use 08/20/2017  . PTSD (post-traumatic stress  disorder) 08/20/2017    Debbe Odea ,PT,DPT 04/08/2019, 11:24 AM  [facility] [address] [place], [place], [address] Phone: [phone]   Fax:  [phone]  Name: Francis Doenges MRN: [id number] Date of Birth: March 22, 1951

## 2019-04-10 DIAGNOSIS — B0089 Other herpesviral infection: Secondary | ICD-10-CM | POA: Diagnosis not present

## 2019-04-10 DIAGNOSIS — D485 Neoplasm of uncertain behavior of skin: Secondary | ICD-10-CM | POA: Diagnosis not present

## 2019-04-10 DIAGNOSIS — D1801 Hemangioma of skin and subcutaneous tissue: Secondary | ICD-10-CM | POA: Diagnosis not present

## 2019-04-10 DIAGNOSIS — L821 Other seborrheic keratosis: Secondary | ICD-10-CM | POA: Diagnosis not present

## 2019-04-15 ENCOUNTER — Other Ambulatory Visit: Payer: Self-pay

## 2019-04-15 ENCOUNTER — Ambulatory Visit: Payer: Medicare Other | Admitting: Physical Therapy

## 2019-04-15 DIAGNOSIS — R2689 Other abnormalities of gait and mobility: Secondary | ICD-10-CM

## 2019-04-15 NOTE — Therapy (Signed)
Matthew Walsh, [place], [address] Phone: [phone]   Fax:  [phone]  Physical Therapy Treatment  Patient Details  Name: Matthew Walsh MRN: [id number] Date of Birth: 06/16/1951 Referring Provider (PT): [name], FNP   Encounter Date: 04/15/2019  PT End of Session - 04/15/19 1056    Visit Number  5    Number of Visits  10    Date for PT Re-Evaluation  04/30/19    Authorization Type  MCR, progress note at 10    PT Start Time  1000    PT Stop Time  1046    PT Time Calculation (min)  46 min    Activity Tolerance  Patient tolerated treatment well    Behavior During Therapy  [facility] for tasks assessed/performed       Past Medical History:  Diagnosis Date  . Asthma    As a child  . Diverticulitis   . GERD (gastroesophageal reflux disease)   . Hypertension     Past Surgical History:  Procedure Laterality Date  . HERNIA REPAIR    . TONSILLECTOMY AND ADENOIDECTOMY      There were no vitals filed for this visit.  Subjective Assessment - 04/15/19 1054    Subjective  Relays PT has changed his life an he feels he feels normal now except for some foot pain. He did not rate intensity, says it is plantar fascitis from working too much around the house    Pertinent History  PMH:BPH,GERD,asthma,HTN, diverticulitis,PTSD    Patient Stated Goals  improve balance         Therex, neuro-reeducation, and Manual therapy performed today:  Warm up on treadmill 2.5 MPH for 5 min gastroc stretch and soleus stretch on slantboard 30 sec X 2 bilat Sitting plantarfascia stretch 30 sec X 2 IASTM, manual stretching to plantarfascia, followed by trial of KT tape to plantarfascia SLS with ball toss X 20 throws bilat Tandem walk with ball toss 20 feet  X6    PT Long Term Goals - 04/08/19 1118      PT LONG TERM GOAL #1   Title  Pt will be I and compliant with HEP. (target for all goals 6 weeks 04/30/19)     Status  On-going      PT LONG TERM GOAL #2   Title  Pt will improve BERG balance test to at least 55 to show improved balance    Baseline  53    Status  On-going      PT LONG TERM GOAL #3   Title  Pt will improve FGA to at least 28 to show improved balance    Baseline  25    Status  On-going            Plan - 04/15/19 1056    Clinical Impression Statement  He had more foot pain today on platar surface of his foot from inc activity so this presented as plantar fasciitis. He was treated with stretching, manual therapy and trial of KT tape to plantar fascia along with continued balance training today. He has made great progress with PT and will likely only need 2 more sessions.    Personal Factors and Comorbidities  Comorbidity 1;Comorbidity 2    Comorbidities  PMH:BPH,GERD,asthma,HTN, diverticulitis,PTSD    Examination-Activity Limitations  Stairs   yardwork on uneven surfaces   Examination-Participation Restrictions  Yard Work    Stability/Clinical Decision Making  Stable/Uncomplicated    Rehab Potential  Excellent    PT Frequency  Other (comment)   1-2   PT Treatment/Interventions  Gait training;Stair training;Therapeutic activities;Therapeutic exercise;Balance training;Neuromuscular re-education    PT Next Visit Plan  needs high level balance training and include uneven surfaces and dynamic balance, perturbations    PT Home Exercise Plan  mod tandem with EC, tandem walk, heel walk, toe walk, walking with head turns, SLS, SL clock       Patient will benefit from skilled therapeutic intervention in order to improve the following deficits and impairments:  Decreased balance  Visit Diagnosis: 1. Other abnormalities of gait and mobility        Problem List Patient Active Problem List   Diagnosis Date Noted  . Pre-diabetes 01/19/2018  . Encounter for general adult medical examination with abnormal findings 01/16/2018  . BPH (benign prostatic hyperplasia) 08/20/2017  .  Hyperlipidemia 08/20/2017  . Diverticulitis 08/20/2017  . GERD (gastroesophageal reflux disease) 08/20/2017  . Hypertension 08/20/2017  . Alcohol use 08/20/2017  . PTSD (post-traumatic stress disorder) 08/20/2017    Silvestre Mesi 04/15/2019, 10:58 AM  [facility] [address] [place], [place], [address] Phone: [phone]   Fax:  [phone]  Name: Matthew Walsh MRN: [id number] Date of Birth: November 06, 1950

## 2019-04-22 ENCOUNTER — Other Ambulatory Visit: Payer: Self-pay

## 2019-04-22 ENCOUNTER — Ambulatory Visit: Payer: Medicare Other | Admitting: Physical Therapy

## 2019-04-22 DIAGNOSIS — R2689 Other abnormalities of gait and mobility: Secondary | ICD-10-CM | POA: Diagnosis not present

## 2019-04-22 NOTE — Therapy (Addendum)
[place], [place], [address] Phone: [phone]   Fax:  [phone]  Physical Therapy Treatment/Discharge PHYSICAL THERAPY DISCHARGE SUMMARY  Visits from Start of Care: 6  Current functional level related to goals / functional outcomes: Back to baseline    Remaining deficits: No deficits   Education / Equipment: HEP Plan: Patient agrees to discharge.  Patient goals were partially met. Patient is being discharged due to meeting the stated rehab goals.  ?????       Patient Details  Name: Matthew Walsh MRN: [id number] Date of Birth: 14-Jul-1951 Referring Provider (PT): [name], FNP   Encounter Date: 04/22/2019  PT End of Session - 04/22/19 1345    Visit Number  6    Number of Visits  10    Date for PT Re-Evaluation  04/30/19    Authorization Type  MCR, progress note at 10    PT Start Time  0930    PT Stop Time  1015    PT Time Calculation (min)  45 min    Activity Tolerance  Patient tolerated treatment well    Behavior During Therapy  [facility] for tasks assessed/performed       Past Medical History:  Diagnosis Date  . Asthma    As a child  . Diverticulitis   . GERD (gastroesophageal reflux disease)   . Hypertension     Past Surgical History:  Procedure Laterality Date  . HERNIA REPAIR    . TONSILLECTOMY AND ADENOIDECTOMY      There were no vitals filed for this visit.  Subjective Assessment - 04/22/19 1332    Subjective  Relays he feels ready for dischage, he has gained his confidence back with his balance, no longer feels unsteady on uneven surfaces and can even climb ladders no problem    Pertinent History  PMH:BPH,GERD,asthma,HTN, diverticulitis,PTSD    Patient Stated Goals  improve balance    Currently in Pain?  No/denies         [facility] PT Assessment - 04/22/19 0001      Ambulation/Gait   Gait Comments  WNL gait pattern and velocity      Berg Balance Test    Sit to Stand  Able to stand without using hands and stabilize independently    Standing Unsupported  Able to stand safely 2 minutes    Sitting with Back Unsupported but Feet Supported on Floor or Stool  Able to sit safely and securely 2 minutes    Stand to Sit  Sits safely with minimal use of hands    Transfers  Able to transfer safely, minor use of hands    Standing Unsupported with Eyes Closed  Able to stand 10 seconds safely    Standing Unsupported with Feet Together  Able to place feet together independently and stand 1 minute safely    From Standing, Reach Forward with Outstretched Arm  Can reach confidently >25 cm (10")    From Standing Position, Pick up Object from Floor  Able to pick up shoe safely and easily    From Standing Position, Turn to Look Behind Over each Shoulder  Looks behind from both sides and weight shifts well    Turn 360 Degrees  Able to turn 360 degrees safely in 4 seconds or less    Standing Unsupported, Alternately Place Feet on Step/Stool  Able to stand independently and safely and complete 8 steps in 20 seconds    Standing Unsupported, One Foot  in [place] to place foot tandem independently and hold 30 seconds    Standing on One Leg  Able to lift leg independently and hold > 10 seconds    Total Score  56      Functional Gait  Assessment   Gait Level Surface  Walks 20 ft in less than 5.5 sec, no assistive devices, good speed, no evidence for imbalance, normal gait pattern, deviates no more than 6 in outside of the 12 in walkway width.    Change in Gait Speed  Able to smoothly change walking speed without loss of balance or gait deviation. Deviate no more than 6 in outside of the 12 in walkway width.    Gait with Horizontal Head Turns  Performs head turns smoothly with no change in gait. Deviates no more than 6 in outside 12 in walkway width    Gait with Vertical Head Turns  Performs head turns with no change in gait. Deviates no more than 6 in outside 12 in  walkway width.    Gait and Pivot Turn  Pivot turns safely within 3 sec and stops quickly with no loss of balance.    Step Over Obstacle  Is able to step over 2 stacked shoe boxes taped together (9 in total height) without changing gait speed. No evidence of imbalance.    Gait with Narrow Base of Support  Is able to ambulate for 10 steps heel to toe with no staggering.    Gait with Eyes Closed  Walks 20 ft, no assistive devices, good speed, no evidence of imbalance, normal gait pattern, deviates no more than 6 in outside 12 in walkway width. Ambulates 20 ft in less than 7 sec.    Ambulating Backwards  Walks 20 ft, no assistive devices, good speed, no evidence for imbalance, normal gait    Steps  Alternating feet, no rail.    Total Score  30        Therex, balance training and manual therapy performed this session  Elliptical warm up 6 min Bosu balance DL eyes closed, DL with ball toss, SLS with ball toss self and with PT tossing to challenge reach and base of support MT for STM/IASTM to plantar fascia bilat with KT tape to bilat plantar fascia    PT Long Term Goals - 04/22/19 1346      PT LONG TERM GOAL #1   Title  Pt will be I and compliant with HEP. (target for all goals 6 weeks 04/30/19)    Status  Achieved      PT LONG TERM GOAL #2   Title  Pt will improve BERG balance test to at least 55 to show improved balance    Baseline  53    Status  Achieved      PT LONG TERM GOAL #3   Title  Pt will improve FGA to at least 28 to show improved balance    Baseline  25    Status  Achieved            Plan - 04/22/19 1345    Clinical Impression Statement  He has made excellent progress with PT, he has no pain and has improved his balance to normal and baseline. He had perfect scores on balance testing today and has met all PT goals. He will be discharged and he had no further questions or concerns.    Personal Factors and Comorbidities  Comorbidity 1;Comorbidity 2    Comorbidities  PMH:BPH,GERD,asthma,HTN, diverticulitis,PTSD    Examination-Activity Limitations  Stairs   yardwork on uneven surfaces   Examination-Participation Restrictions  Yard Work    Stability/Clinical Decision Making  Stable/Uncomplicated    Rehab Potential  Excellent    PT Frequency  Other (comment)   1-2   PT Treatment/Interventions  Gait training;Stair training;Therapeutic activities;Therapeutic exercise;Balance training;Neuromuscular re-education    PT Next Visit Plan  needs high level balance training and include uneven surfaces and dynamic balance, perturbations    PT Home Exercise Plan  mod tandem with EC, tandem walk, heel walk, toe walk, walking with head turns, SLS, SL clock       Patient will benefit from skilled therapeutic intervention in order to improve the following deficits and impairments:  Decreased balance  Visit Diagnosis: 1. Other abnormalities of gait and mobility        Problem List Patient Active Problem List   Diagnosis Date Noted  . Pre-diabetes 01/19/2018  . Encounter for general adult medical examination with abnormal findings 01/16/2018  . BPH (benign prostatic hyperplasia) 08/20/2017  . Hyperlipidemia 08/20/2017  . Diverticulitis 08/20/2017  . GERD (gastroesophageal reflux disease) 08/20/2017  . Hypertension 08/20/2017  . Alcohol use 08/20/2017  . PTSD (post-traumatic stress disorder) 08/20/2017    Silvestre Mesi 04/22/2019, 1:47 PM  [facility] [address] [place], [place], [address] Phone: [phone]   Fax:  [phone]  Name: Acea Yagi MRN: [id number] Date of Birth: [birth year]

## 2019-04-25 ENCOUNTER — Other Ambulatory Visit: Payer: Self-pay | Admitting: Family

## 2019-04-25 ENCOUNTER — Telehealth: Payer: Self-pay

## 2019-04-25 DIAGNOSIS — R7303 Prediabetes: Secondary | ICD-10-CM

## 2019-04-25 DIAGNOSIS — R972 Elevated prostate specific antigen [PSA]: Secondary | ICD-10-CM

## 2019-04-25 DIAGNOSIS — E785 Hyperlipidemia, unspecified: Secondary | ICD-10-CM

## 2019-04-25 NOTE — Telephone Encounter (Signed)
Orders in place; come in October as we discussed.

## 2019-04-25 NOTE — Telephone Encounter (Signed)
Copied from [place] [id number]. Topic: General - Inquiry >> Apr 25, 2019 10:16 AM Richardo Priest, NT wrote: Reason for CRM: Patient called in stating that according to PCP F/U for labs is needed in October. Please advise and place order. Call back is [phone]. >> Apr 25, 2019 10:34 AM Para Skeans A wrote: Patient has a yearly FU made for 03/2020.

## 2019-04-28 NOTE — Telephone Encounter (Signed)
Spoke with patient today. 

## 2019-05-20 DIAGNOSIS — Z23 Encounter for immunization: Secondary | ICD-10-CM | POA: Diagnosis not present

## 2019-05-27 ENCOUNTER — Encounter: Payer: Self-pay | Admitting: Family

## 2019-07-08 ENCOUNTER — Other Ambulatory Visit (INDEPENDENT_AMBULATORY_CARE_PROVIDER_SITE_OTHER): Payer: Medicare Other

## 2019-07-08 DIAGNOSIS — R972 Elevated prostate specific antigen [PSA]: Secondary | ICD-10-CM | POA: Diagnosis not present

## 2019-07-08 DIAGNOSIS — E785 Hyperlipidemia, unspecified: Secondary | ICD-10-CM | POA: Diagnosis not present

## 2019-07-08 DIAGNOSIS — R7303 Prediabetes: Secondary | ICD-10-CM

## 2019-07-08 LAB — LIPID PANEL
Cholesterol: 229 mg/dL — ABNORMAL HIGH (ref 0–200)
HDL: 36.2 mg/dL — ABNORMAL LOW (ref >39.00)
LDL Cholesterol: 167 mg/dL — ABNORMAL HIGH (ref 0–99)
NonHDL: 193.26
Total CHOL/HDL Ratio: 6
Triglycerides: 129 mg/dL (ref 0.0–149.0)
VLDL: 25.8 mg/dL (ref 0.0–40.0)

## 2019-07-08 LAB — COMPREHENSIVE METABOLIC PANEL
ALT: 15 U/L (ref 0–53)
AST: 14 U/L (ref 0–37)
Albumin: 4.5 g/dL (ref 3.5–5.2)
Alkaline Phosphatase: 57 U/L (ref 39–117)
BUN: 18 mg/dL (ref 6–23)
CO2: 26 mEq/L (ref 19–32)
Calcium: 11.1 mg/dL — ABNORMAL HIGH (ref 8.4–10.5)
Chloride: 107 mEq/L (ref 96–112)
Creatinine, Ser: 1.23 mg/dL (ref 0.40–1.50)
GFR: 58.7 mL/min — ABNORMAL LOW (ref >60.00)
Glucose, Bld: 98 mg/dL (ref 70–99)
Potassium: 4.5 mEq/L (ref 3.5–5.1)
Sodium: 140 mEq/L (ref 135–145)
Total Bilirubin: 0.9 mg/dL (ref 0.2–1.2)
Total Protein: 6.9 g/dL (ref 6.0–8.3)

## 2019-07-08 LAB — PSA: PSA: 5.08 ng/mL — ABNORMAL HIGH (ref 0.10–4.00)

## 2019-07-08 LAB — HEMOGLOBIN A1C: Hgb A1c MFr Bld: 6 % (ref 4.6–6.5)

## 2019-07-09 ENCOUNTER — Other Ambulatory Visit: Payer: Self-pay | Admitting: Family

## 2019-07-10 ENCOUNTER — Telehealth: Payer: Self-pay | Admitting: Family

## 2019-07-10 NOTE — Telephone Encounter (Signed)
Patient stated he did get the message from Matthew Walsh but he is confused about if he should go back on the medication before he takes his blood test in two weeks or not.  Please advise.

## 2019-07-11 MED ORDER — ROSUVASTATIN CALCIUM 20 MG PO TABS: 20.0000 mg | ORAL_TABLET | Freq: Every day | ORAL | 3 refills | Status: DC

## 2019-07-11 NOTE — Telephone Encounter (Signed)
See below- sorry if this is a repeat

## 2019-07-11 NOTE — Telephone Encounter (Signed)
Yes, let's go back on statin regardless of calcium; they are separate issues. I would actually like to try a different statin called Crestor (Rosuvastatin)- it's just a better drug than Zocor and I want to make sure we are covering his risks appropriately.

## 2019-07-11 NOTE — Telephone Encounter (Signed)
Sent patient a my-chart regarding info.

## 2019-07-14 NOTE — Telephone Encounter (Signed)
Message read by patient via my-chart.

## 2019-07-28 ENCOUNTER — Other Ambulatory Visit: Payer: Medicare Other

## 2019-07-30 LAB — PTH, INTACT AND CALCIUM
Calcium: 10.5 mg/dL — ABNORMAL HIGH (ref 8.6–10.3)
PTH: 44 pg/mL (ref 14–64)

## 2019-11-09 ENCOUNTER — Ambulatory Visit: Payer: Medicare Other | Attending: Internal Medicine

## 2019-11-09 DIAGNOSIS — Z23 Encounter for immunization: Secondary | ICD-10-CM | POA: Insufficient documentation

## 2019-11-09 NOTE — Progress Notes (Signed)
   Covid-19 Vaccination Clinic  Name:  Matthew Walsh    MRN: [id number] DOB: 30-Jan-1951  11/09/2019  [name] was observed post Covid-19 immunization for 15 minutes without incidence. He was provided with Vaccine Information Sheet and instruction to access the V-Safe system.   [name] was instructed to call 911 with any severe reactions post vaccine: Marland Kitchen Difficulty breathing  . Swelling of your face and throat  . A fast heartbeat  . A bad rash all over your body  . Dizziness and weakness    Immunizations Administered    Name Date Dose VIS Date Route   Pfizer COVID-19 Vaccine 11/09/2019  4:51 PM 0.3 mL 09/12/2019 Intramuscular   Manufacturer: [place]   Lot: [id number]   [place]: [id number]

## 2019-11-28 ENCOUNTER — Telehealth: Payer: Self-pay | Admitting: Family

## 2019-11-28 NOTE — Telephone Encounter (Signed)
Please verify if he needs me for something other than a Zoloft refill; if it's just Zoloft, he was given a year refill in June 2020? I can refill for him without an OV if the prescription just needs to be updated.

## 2019-11-28 NOTE — Telephone Encounter (Signed)
Spoke with patient and info given. He will keep appointment for Monday and get meds for one year.

## 2019-11-30 ENCOUNTER — Ambulatory Visit: Payer: Medicare Other

## 2019-12-01 ENCOUNTER — Other Ambulatory Visit: Payer: Self-pay

## 2019-12-01 ENCOUNTER — Encounter: Payer: Self-pay | Admitting: Family

## 2019-12-01 ENCOUNTER — Other Ambulatory Visit: Payer: Self-pay | Admitting: Family

## 2019-12-01 ENCOUNTER — Ambulatory Visit (INDEPENDENT_AMBULATORY_CARE_PROVIDER_SITE_OTHER): Payer: Medicare Other | Admitting: Family

## 2019-12-01 VITALS — BP 116/80 | HR 64 | Temp 98.1°F | Ht 70.0 in | Wt 188.2 lb

## 2019-12-01 DIAGNOSIS — F4310 Post-traumatic stress disorder, unspecified: Secondary | ICD-10-CM | POA: Diagnosis not present

## 2019-12-01 DIAGNOSIS — R972 Elevated prostate specific antigen [PSA]: Secondary | ICD-10-CM | POA: Diagnosis not present

## 2019-12-01 DIAGNOSIS — M6530 Trigger finger, unspecified finger: Secondary | ICD-10-CM

## 2019-12-01 DIAGNOSIS — I10 Essential (primary) hypertension: Secondary | ICD-10-CM | POA: Diagnosis not present

## 2019-12-01 DIAGNOSIS — R7303 Prediabetes: Secondary | ICD-10-CM | POA: Diagnosis not present

## 2019-12-01 DIAGNOSIS — N400 Enlarged prostate without lower urinary tract symptoms: Secondary | ICD-10-CM

## 2019-12-01 DIAGNOSIS — E785 Hyperlipidemia, unspecified: Secondary | ICD-10-CM

## 2019-12-01 LAB — CBC WITH DIFFERENTIAL/PLATELET
Basophils Absolute: 0 10*3/uL (ref 0.0–0.1)
Basophils Relative: 0.8 % (ref 0.0–3.0)
Eosinophils Absolute: 0.1 10*3/uL (ref 0.0–0.7)
Eosinophils Relative: 1.1 % (ref 0.0–5.0)
HCT: 44 % (ref 39.0–52.0)
Hemoglobin: 14.6 g/dL (ref 13.0–17.0)
Lymphocytes Relative: 30.3 % (ref 12.0–46.0)
Lymphs Abs: 1.9 10*3/uL (ref 0.7–4.0)
MCHC: 33.3 g/dL (ref 30.0–36.0)
MCV: 92.1 fl (ref 78.0–100.0)
Monocytes Absolute: 0.3 10*3/uL (ref 0.1–1.0)
Monocytes Relative: 5.3 % (ref 3.0–12.0)
Neutro Abs: 3.8 10*3/uL (ref 1.4–7.7)
Neutrophils Relative %: 62.5 % (ref 43.0–77.0)
Platelets: 173 10*3/uL (ref 150.0–400.0)
RBC: 4.78 Mil/uL (ref 4.22–5.81)
RDW: 13.7 % (ref 11.5–15.5)
WBC: 6.1 10*3/uL (ref 4.0–10.5)

## 2019-12-01 LAB — COMPREHENSIVE METABOLIC PANEL
ALT: 25 U/L (ref 0–53)
AST: 13 U/L (ref 0–37)
Albumin: 4.6 g/dL (ref 3.5–5.2)
Alkaline Phosphatase: 64 U/L (ref 39–117)
BUN: 19 mg/dL (ref 6–23)
CO2: 30 mEq/L (ref 19–32)
Calcium: 11 mg/dL — ABNORMAL HIGH (ref 8.4–10.5)
Chloride: 104 mEq/L (ref 96–112)
Creatinine, Ser: 1.26 mg/dL (ref 0.40–1.50)
GFR: 57.02 mL/min — ABNORMAL LOW (ref >60.00)
Glucose, Bld: 97 mg/dL (ref 70–99)
Potassium: 4.9 mEq/L (ref 3.5–5.1)
Sodium: 140 mEq/L (ref 135–145)
Total Bilirubin: 0.9 mg/dL (ref 0.2–1.2)
Total Protein: 7.3 g/dL (ref 6.0–8.3)

## 2019-12-01 LAB — LIPID PANEL
Cholesterol: 140 mg/dL (ref 0–200)
HDL: 39.9 mg/dL (ref >39.00)
LDL Cholesterol: 77 mg/dL (ref 0–99)
NonHDL: 100.59
Total CHOL/HDL Ratio: 4
Triglycerides: 116 mg/dL (ref 0.0–149.0)
VLDL: 23.2 mg/dL (ref 0.0–40.0)

## 2019-12-01 LAB — PSA: PSA: 4.6 ng/mL — ABNORMAL HIGH (ref 0.10–4.00)

## 2019-12-01 LAB — HEMOGLOBIN A1C: Hgb A1c MFr Bld: 5.9 % (ref 4.6–6.5)

## 2019-12-01 MED ORDER — SERTRALINE HCL 100 MG PO TABS: 100.0000 mg | ORAL_TABLET | Freq: Every day | ORAL | 3 refills | Status: DC

## 2019-12-01 MED ORDER — ROSUVASTATIN CALCIUM 20 MG PO TABS: 20.0000 mg | ORAL_TABLET | Freq: Every day | ORAL | 3 refills | Status: DC

## 2019-12-01 MED ORDER — PRAZOSIN HCL 5 MG PO CAPS: 5.0000 mg | ORAL_CAPSULE | Freq: Two times a day (BID) | ORAL | 3 refills | Status: DC

## 2019-12-01 NOTE — Progress Notes (Signed)
Matthew Walsh is a 69 y.o. male with the following history as recorded in EpicCare:  Patient Active Problem List   Diagnosis Date Noted  . Pre-diabetes 01/19/2018  . Encounter for general adult medical examination with abnormal findings 01/16/2018  . BPH (benign prostatic hyperplasia) 08/20/2017  . Hyperlipidemia 08/20/2017  . Diverticulitis 08/20/2017  . GERD (gastroesophageal reflux disease) 08/20/2017  . Hypertension 08/20/2017  . Alcohol use 08/20/2017  . PTSD (post-traumatic stress disorder) 08/20/2017    Current Outpatient Medications  Medication Sig Dispense Refill  . Methylcellulose, Laxative, (FIBER THERAPY PO) Take by mouth.    . NETTLE-PYGEUM AFRICANUM PO Take by mouth.    . prazosin (MINIPRESS) 5 MG capsule Take 1 capsule (5 mg total) by mouth 2 (two) times daily. 180 capsule 3  . rosuvastatin (CRESTOR) 20 MG tablet Take 1 tablet (20 mg total) by mouth daily. 90 tablet 3  . saw palmetto 500 MG capsule Take 500 mg by mouth daily.    . sertraline (ZOLOFT) 100 MG tablet Take 1 tablet (100 mg total) by mouth daily. 90 tablet 3  . valACYclovir (VALTREX) 1000 MG tablet SMARTSIG:2 Tablet(s) By Mouth 1 to 2 Times Daily     No current facility-administered medications for this visit.    Allergies: Patient has no known allergies.  Past Medical History:  Diagnosis Date  . Asthma    As a child  . Diverticulitis   . GERD (gastroesophageal reflux disease)   . Hypertension     Past Surgical History:  Procedure Laterality Date  . HERNIA REPAIR    . TONSILLECTOMY AND ADENOIDECTOMY      Family History  Problem Relation Age of Onset  . Diabetes Mother   . Heart attack Mother   . Miscarriages / [place] Mother   . Alcohol abuse Father   . Early death Father   . Hearing loss Father   . Heart disease Father   . Hyperlipidemia Father   . Hypertension Father   . Alcohol abuse Brother   . Alcohol abuse Maternal Grandmother   . Diabetes Maternal Grandmother   . Hearing  loss Maternal Grandmother   . Heart disease Maternal Grandmother   . Hearing loss Maternal Grandfather   . Heart disease Maternal Grandfather   . Heart disease Paternal Grandmother   . Heart disease Paternal Grandfather     Social History   Tobacco Use  . Smoking status: Former Research scientist (life sciences)  . Smokeless tobacco: Never Used  Substance Use Topics  . Alcohol use: Yes    Comment: daily    Subjective:  Patient is here for yearly check in; doing well today; has lost 12 pounds in the past year- working on healthy eating; no alcohol since November 2019;  Will be going to [place] for 6 months to fix up new home; Needs updated refills-   Objective:  Vitals:   12/01/19 0932  BP: 116/80  Pulse: 64  Temp: 98.1 F (36.7 C)  TempSrc: Oral  SpO2: 98%  Weight: 188 lb 3.2 oz (85.4 kg)  Height: 5' 10"  (1.778 m)    General: Well developed, well nourished, in no acute distress  Skin : Warm and dry.  Head: Normocephalic and atraumatic  Lungs: Respirations unlabored; clear to auscultation bilaterally without wheeze, rales, rhonchi  CVS exam: normal rate and regular rhythm.  Musculoskeletal: No deformities; no active joint inflammation  Extremities: No edema, cyanosis, clubbing  Vessels: Symmetric bilaterally  Neurologic: Alert and oriented; speech intact; face symmetrical; moves all extremities  well; CNII-XII intact without focal deficit   Assessment:  1. Essential hypertension   2. Hyperlipidemia, unspecified hyperlipidemia type   3. Elevated PSA   4. Benign prostatic hyperplasia, unspecified whether lower urinary tract symptoms present   5. Pre-diabetes   6. PTSD (post-traumatic stress disorder)   7. Trigger finger, unspecified finger, unspecified laterality     Plan:  Labs and refills updated; congratulated on commitment to health; plan to follow-up in 1 year;   This visit occurred during the SARS-CoV-2 public health emergency.  Safety protocols were in place, including screening  questions prior to the visit, additional usage of staff PPE, and extensive cleaning of exam room while observing appropriate contact time as indicated for disinfecting solutions.     No follow-ups on file.  Orders Placed This Encounter  Procedures  . CBC w/Diff  . Comp Met (CMET)  . Lipid panel  . PSA  . HgB A1c  . Ambulatory referral to Orthopedic Surgery    Referral Priority:   Routine    Referral Type:   Surgical    Referral Reason:   Specialty Services Required    Referred to Provider:   Leandrew Koyanagi, MD    Requested Specialty:   Orthopedic Surgery    Number of Visits Requested:   1    Requested Prescriptions   Signed Prescriptions Disp Refills  . prazosin (MINIPRESS) 5 MG capsule 180 capsule 3    Sig: Take 1 capsule (5 mg total) by mouth 2 (two) times daily.  . rosuvastatin (CRESTOR) 20 MG tablet 90 tablet 3    Sig: Take 1 tablet (20 mg total) by mouth daily.  . sertraline (ZOLOFT) 100 MG tablet 90 tablet 3    Sig: Take 1 tablet (100 mg total) by mouth daily.

## 2019-12-04 ENCOUNTER — Ambulatory Visit: Payer: Medicare Other | Attending: Internal Medicine

## 2019-12-04 DIAGNOSIS — Z23 Encounter for immunization: Secondary | ICD-10-CM | POA: Insufficient documentation

## 2019-12-04 NOTE — Progress Notes (Signed)
   Covid-19 Vaccination Clinic  Name:  Spero Wehe    MRN: [id number] DOB: January 01, 1951  12/04/2019  Mr. Fontaine was observed post Covid-19 immunization for 15 minutes without incident. He was provided with Vaccine Information Sheet and instruction to access the V-Safe system.   Mr. Mcgloin was instructed to call 911 with any severe reactions post vaccine: Marland Kitchen Difficulty breathing  . Swelling of face and throat  . A fast heartbeat  . A bad rash all over body  . Dizziness and weakness   Immunizations Administered    Name Date Dose VIS Date Route   Pfizer COVID-19 Vaccine 12/04/2019  1:05 PM 0.3 mL 09/12/2019 Intramuscular   Manufacturer: [place]   Lot: [id number]   [place]: [id number]

## 2019-12-10 ENCOUNTER — Encounter: Payer: Self-pay | Admitting: Orthopaedic Surgery

## 2019-12-10 ENCOUNTER — Ambulatory Visit (INDEPENDENT_AMBULATORY_CARE_PROVIDER_SITE_OTHER): Payer: Medicare Other | Admitting: Orthopaedic Surgery

## 2019-12-10 ENCOUNTER — Other Ambulatory Visit: Payer: Self-pay

## 2019-12-10 DIAGNOSIS — M65331 Trigger finger, right middle finger: Secondary | ICD-10-CM | POA: Diagnosis not present

## 2019-12-10 MED ORDER — METHYLPREDNISOLONE ACETATE 40 MG/ML IJ SUSP
13.3300 mg | INTRAMUSCULAR | Status: AC | PRN
  Administered 2019-12-10: 13.33 mg

## 2019-12-10 MED ORDER — LIDOCAINE HCL 1 % IJ SOLN
0.3000 mL | INTRAMUSCULAR | Status: AC | PRN
  Administered 2019-12-10: .3 mL

## 2019-12-10 MED ORDER — BUPIVACAINE HCL 0.5 % IJ SOLN
0.3300 mL | INTRAMUSCULAR | Status: AC | PRN
  Administered 2019-12-10: .33 mL

## 2019-12-10 NOTE — Progress Notes (Signed)
Office Visit Note   Patient: Matthew Walsh           Date of Birth: 11/16/1950           MRN: [id number] Visit Date: 12/10/2019              Requested by: [name], [place],  [place] [address] PCP: [name], FNP   Assessment & Plan: Visit Diagnoses:  1. Trigger finger, right middle finger     Plan: My impression is recurrent right long trigger finger.  Injection was repeated today.  Patient tolerates well.  Follow-up as needed.  Follow-Up Instructions: Return if symptoms worsen or fail to improve.   Orders:  No orders of the defined types were placed in this encounter.  No orders of the defined types were placed in this encounter.     Procedures: Hand/UE Inj: R long A1 for trigger finger on 12/10/2019 4:53 PM Indications: pain Details: 25 G needle Medications: 0.3 mL lidocaine 1 %; 0.33 mL bupivacaine 0.5 %; 13.33 mg methylPREDNISolone acetate 40 MG/ML Outcome: tolerated well, no immediate complications Consent was given by the patient. Patient was prepped and draped in the usual sterile fashion.       Clinical Data: No additional findings.   Subjective: Chief Complaint  Patient presents with  . Right Middle Finger - Pain    Bill returns today for recurrent right long trigger finger.  He got injection in June 2020 which helped a lot.  He is traveling to [place] next week and would like to have another injection since he has plans working on his new property that he purchased.   Review of Systems   Objective: Vital Signs: There were no vitals taken for this visit.  Physical Exam  Ortho Exam Right long finger exam shows tenderness in the palm in line with the long finger overlying the flexor tendon sheath and A1 pulley.  There is palpable triggering. Specialty Comments:  No specialty comments available.  Imaging: No results found.   PMFS History: Patient Active Problem List   Diagnosis Date Noted    . Trigger finger, right middle finger 12/10/2019  . Pre-diabetes 01/19/2018  . Encounter for general adult medical examination with abnormal findings 01/16/2018  . BPH (benign prostatic hyperplasia) 08/20/2017  . Hyperlipidemia 08/20/2017  . Diverticulitis 08/20/2017  . GERD (gastroesophageal reflux disease) 08/20/2017  . Hypertension 08/20/2017  . Alcohol use 08/20/2017  . PTSD (post-traumatic stress disorder) 08/20/2017   Past Medical History:  Diagnosis Date  . Asthma    As a child  . Diverticulitis   . GERD (gastroesophageal reflux disease)   . Hypertension     Family History  Problem Relation Age of Onset  . Diabetes Mother   . Heart attack Mother   . Miscarriages / [place] Mother   . Alcohol abuse Father   . Early death Father   . Hearing loss Father   . Heart disease Father   . Hyperlipidemia Father   . Hypertension Father   . Alcohol abuse Brother   . Alcohol abuse Maternal Grandmother   . Diabetes Maternal Grandmother   . Hearing loss Maternal Grandmother   . Heart disease Maternal Grandmother   . Hearing loss Maternal Grandfather   . Heart disease Maternal Grandfather   . Heart disease Paternal Grandmother   . Heart disease Paternal Grandfather     Past Surgical History:  Procedure Laterality Date  . HERNIA REPAIR    .  TONSILLECTOMY AND ADENOIDECTOMY     Social History   Occupational History  . Not on file  Tobacco Use  . Smoking status: Former Research scientist (life sciences)  . Smokeless tobacco: Never Used  Substance and Sexual Activity  . Alcohol use: Yes    Comment: daily  . Drug use: No  . Sexual activity: Yes    Partners: Female

## 2019-12-15 ENCOUNTER — Ambulatory Visit: Payer: Medicare Other

## 2019-12-15 ENCOUNTER — Other Ambulatory Visit: Payer: Self-pay

## 2019-12-15 ENCOUNTER — Ambulatory Visit: Payer: Medicare Other | Attending: Internal Medicine

## 2019-12-15 DIAGNOSIS — Z20822 Contact with and (suspected) exposure to covid-19: Secondary | ICD-10-CM | POA: Diagnosis not present

## 2019-12-16 LAB — PTH, INTACT AND CALCIUM
Calcium: 10.5 mg/dL — ABNORMAL HIGH (ref 8.6–10.3)
PTH: 60 pg/mL (ref 14–64)

## 2019-12-16 LAB — NOVEL CORONAVIRUS, NAA: SARS-CoV-2, NAA: NOT DETECTED

## 2020-03-05 ENCOUNTER — Ambulatory Visit: Payer: Medicare Other | Admitting: Family

## 2020-06-29 DIAGNOSIS — B0089 Other herpesviral infection: Secondary | ICD-10-CM | POA: Diagnosis not present

## 2020-06-29 DIAGNOSIS — D1801 Hemangioma of skin and subcutaneous tissue: Secondary | ICD-10-CM | POA: Diagnosis not present

## 2020-06-29 DIAGNOSIS — D485 Neoplasm of uncertain behavior of skin: Secondary | ICD-10-CM | POA: Diagnosis not present

## 2020-06-29 DIAGNOSIS — Z23 Encounter for immunization: Secondary | ICD-10-CM | POA: Diagnosis not present

## 2020-06-29 DIAGNOSIS — L570 Actinic keratosis: Secondary | ICD-10-CM | POA: Diagnosis not present

## 2020-07-16 ENCOUNTER — Other Ambulatory Visit: Payer: Self-pay

## 2020-07-16 ENCOUNTER — Ambulatory Visit (INDEPENDENT_AMBULATORY_CARE_PROVIDER_SITE_OTHER): Payer: Medicare Other | Admitting: Family

## 2020-07-16 VITALS — BP 124/76 | HR 55 | Temp 98.2°F | Ht 70.0 in | Wt 183.0 lb

## 2020-07-16 DIAGNOSIS — N401 Enlarged prostate with lower urinary tract symptoms: Secondary | ICD-10-CM | POA: Diagnosis not present

## 2020-07-16 DIAGNOSIS — R7309 Other abnormal glucose: Secondary | ICD-10-CM

## 2020-07-16 DIAGNOSIS — M79642 Pain in left hand: Secondary | ICD-10-CM | POA: Diagnosis not present

## 2020-07-16 DIAGNOSIS — M79641 Pain in right hand: Secondary | ICD-10-CM | POA: Diagnosis not present

## 2020-07-16 DIAGNOSIS — E785 Hyperlipidemia, unspecified: Secondary | ICD-10-CM | POA: Diagnosis not present

## 2020-07-16 LAB — CBC WITH DIFFERENTIAL/PLATELET
Basophils Absolute: 0 10*3/uL (ref 0.0–0.1)
Basophils Relative: 0.2 % (ref 0.0–3.0)
Eosinophils Absolute: 0.1 10*3/uL (ref 0.0–0.7)
Eosinophils Relative: 1.5 % (ref 0.0–5.0)
HCT: 41.2 % (ref 39.0–52.0)
Hemoglobin: 13.8 g/dL (ref 13.0–17.0)
Lymphocytes Relative: 33.8 % (ref 12.0–46.0)
Lymphs Abs: 1.8 10*3/uL (ref 0.7–4.0)
MCHC: 33.6 g/dL (ref 30.0–36.0)
MCV: 90.8 fl (ref 78.0–100.0)
Monocytes Absolute: 0.3 10*3/uL (ref 0.1–1.0)
Monocytes Relative: 6.1 % (ref 3.0–12.0)
Neutro Abs: 3.2 10*3/uL (ref 1.4–7.7)
Neutrophils Relative %: 58.4 % (ref 43.0–77.0)
Platelets: 162 10*3/uL (ref 150.0–400.0)
RBC: 4.53 Mil/uL (ref 4.22–5.81)
RDW: 14.3 % (ref 11.5–15.5)
WBC: 5.4 10*3/uL (ref 4.0–10.5)

## 2020-07-16 LAB — LIPID PANEL
Cholesterol: 172 mg/dL (ref 0–200)
HDL: 40.1 mg/dL (ref >39.00)
LDL Cholesterol: 114 mg/dL — ABNORMAL HIGH (ref 0–99)
NonHDL: 131.61
Total CHOL/HDL Ratio: 4
Triglycerides: 87 mg/dL (ref 0.0–149.0)
VLDL: 17.4 mg/dL (ref 0.0–40.0)

## 2020-07-16 LAB — COMPREHENSIVE METABOLIC PANEL
ALT: 21 U/L (ref 0–53)
AST: 15 U/L (ref 0–37)
Albumin: 4.4 g/dL (ref 3.5–5.2)
Alkaline Phosphatase: 71 U/L (ref 39–117)
BUN: 24 mg/dL — ABNORMAL HIGH (ref 6–23)
CO2: 27 mEq/L (ref 19–32)
Calcium: 10.3 mg/dL (ref 8.4–10.5)
Chloride: 106 mEq/L (ref 96–112)
Creatinine, Ser: 1.23 mg/dL (ref 0.40–1.50)
GFR: 59.95 mL/min — ABNORMAL LOW (ref >60.00)
Glucose, Bld: 99 mg/dL (ref 70–99)
Potassium: 4.2 mEq/L (ref 3.5–5.1)
Sodium: 139 mEq/L (ref 135–145)
Total Bilirubin: 0.9 mg/dL (ref 0.2–1.2)
Total Protein: 6.9 g/dL (ref 6.0–8.3)

## 2020-07-16 LAB — HEMOGLOBIN A1C: Hgb A1c MFr Bld: 6 % (ref 4.6–6.5)

## 2020-07-16 LAB — PSA: PSA: 5.54 ng/mL — ABNORMAL HIGH (ref 0.10–4.00)

## 2020-07-16 MED ORDER — TAMSULOSIN HCL 0.4 MG PO CAPS: 0.4000 mg | ORAL_CAPSULE | Freq: Every day | ORAL | 3 refills | Status: DC

## 2020-07-16 MED ORDER — MELOXICAM 15 MG PO TABS: 15.0000 mg | ORAL_TABLET | Freq: Every day | ORAL | 1 refills | Status: DC

## 2020-07-16 NOTE — Progress Notes (Signed)
Matthew Walsh is a 69 y.o. male with the following history as recorded in Epic[facility]:  Patient Active Problem List   Diagnosis Date Noted  . Trigger finger, right middle finger 12/10/2019  . Pre-diabetes 01/19/2018  . Encounter for general adult medical examination with abnormal findings 01/16/2018  . BPH (benign prostatic hyperplasia) 08/20/2017  . Hyperlipidemia 08/20/2017  . Diverticulitis 08/20/2017  . GERD (gastroesophageal reflux disease) 08/20/2017  . Hypertension 08/20/2017  . Alcohol use 08/20/2017  . PTSD (post-traumatic stress disorder) 08/20/2017    Current Outpatient Medications  Medication Sig Dispense Refill  . prazosin (MINIPRESS) 5 MG capsule Take 1 capsule (5 mg total) by mouth 2 (two) times daily. 180 capsule 3  . rosuvastatin (CRESTOR) 20 MG tablet Take 1 tablet (20 mg total) by mouth daily. 90 tablet 3  . sertraline (ZOLOFT) 100 MG tablet Take 1 tablet (100 mg total) by mouth daily. 90 tablet 3  . valACYclovir (VALTREX) 1000 MG tablet SMARTSIG:2 Tablet(s) By Mouth 1 to 2 Times Daily    . meloxicam (MOBIC) 15 MG tablet Take 1 tablet (15 mg total) by mouth daily. 30 tablet 1  . tamsulosin (FLOMAX) 0.4 MG CAPS capsule Take 1 capsule (0.4 mg total) by mouth daily. 30 capsule 3   No current facility-administered medications for this visit.    Allergies: Patient has no known allergies.  Past Medical History:  Diagnosis Date  . Asthma    As a child  . Diverticulitis   . GERD (gastroesophageal reflux disease)   . Hypertension     Past Surgical History:  Procedure Laterality Date  . HERNIA REPAIR    . TONSILLECTOMY AND ADENOIDECTOMY      Family History  Problem Relation Age of Onset  . Diabetes Mother   . Heart attack Mother   . Miscarriages / [place] Mother   . Alcohol abuse Father   . Early death Father   . Hearing loss Father   . Heart disease Father   . Hyperlipidemia Father   . Hypertension Father   . Alcohol abuse Brother   . Alcohol abuse  Maternal Grandmother   . Diabetes Maternal Grandmother   . Hearing loss Maternal Grandmother   . Heart disease Maternal Grandmother   . Hearing loss Maternal Grandfather   . Heart disease Maternal Grandfather   . Heart disease Paternal Grandmother   . Heart disease Paternal Grandfather     Social History   Tobacco Use  . Smoking status: Former Research scientist (life sciences)  . Smokeless tobacco: Never Used  Substance Use Topics  . Alcohol use: Yes    Comment: daily    Subjective:  Presents for 6 month follow-up on chronic care needs; in baseline state of health today; has been having some pain in his hands due to recent increased home improvement activities- wanted to discuss the safety of using NSAIDs; Has had flu and COVID booster;  Has lost 5 more pounds since last OV;  Sees dermatology regularly- just had treatment for AK;  Objective:  Vitals:   07/16/20 1005  BP: 124/76  Pulse: (!) 55  Temp: 98.2 F (36.8 C)  TempSrc: Oral  SpO2: 97%  Weight: 183 lb (83 kg)  Height: _0  (1.778 m)    General: Well developed, well nourished, in no acute distress  Skin : Warm and dry.  Head: Normocephalic and atraumatic  Eyes: Sclera and conjunctiva clear; pupils round and reactive to light; extraocular movements intact  Ears: External normal; canals clear; tympanic membranes normal  Oropharynx: Pink, supple. No suspicious lesions  Neck: Supple without thyromegaly, adenopathy  Lungs: Respirations unlabored; clear to auscultation bilaterally without wheeze, rales, rhonchi  CVS exam: normal rate and regular rhythm.  Vessels: Symmetric bilaterally  Neurologic: Alert and oriented; speech intact; face symmetrical; moves all extremities well; CNII-XII intact without focal deficit   Assessment:  1. Hyperlipidemia, unspecified hyperlipidemia type   2. Elevated glucose   3. Benign prostatic hyperplasia with lower urinary tract symptoms, symptom details unspecified     Plan:  1. Check labs today; continue  Crestor as prescribed; 2. Check Hgba1c today; 3. Check PSA today; trial of Flomax; 4. Trial of Mobic 15 mg daily for hand pain;   This visit occurred during the SARS-CoV-2 public health emergency.  Safety protocols were in place, including screening questions prior to the visit, additional usage of staff PPE, and extensive cleaning of exam room while observing appropriate contact time as indicated for disinfecting solutions.     No follow-ups on file.  Orders Placed This Encounter  Procedures  . CBC with Differential/Platelet    Standing Status:   Future    Standing Expiration Date:   07/16/2021  . Comp Met (CMET)    Standing Status:   Future    Standing Expiration Date:   07/16/2021  . Lipid panel    Standing Status:   Future    Standing Expiration Date:   07/16/2021  . Hemoglobin A1c    Standing Status:   Future    Standing Expiration Date:   07/16/2021  . PSA    Standing Status:   Future    Standing Expiration Date:   07/16/2021    Requested Prescriptions   Signed Prescriptions Disp Refills  . meloxicam (MOBIC) 15 MG tablet 30 tablet 1    Sig: Take 1 tablet (15 mg total) by mouth daily.  . tamsulosin (FLOMAX) 0.4 MG CAPS capsule 30 capsule 3    Sig: Take 1 capsule (0.4 mg total) by mouth daily.

## 2020-08-03 ENCOUNTER — Telehealth: Payer: Self-pay | Admitting: Family

## 2020-08-03 NOTE — Telephone Encounter (Signed)
Patient states he has been taking the medication and it hasn't been helping his arthritis and Mickel Baas informed him to reach out if it didn't so he could get referred to a specialist.  meloxicam (MOBIC) 15 MG tablet

## 2020-08-04 ENCOUNTER — Other Ambulatory Visit: Payer: Self-pay | Admitting: Family

## 2020-08-04 DIAGNOSIS — M79641 Pain in right hand: Secondary | ICD-10-CM

## 2020-08-10 ENCOUNTER — Ambulatory Visit (INDEPENDENT_AMBULATORY_CARE_PROVIDER_SITE_OTHER): Payer: Medicare Other | Admitting: Orthopaedic Surgery

## 2020-08-10 ENCOUNTER — Other Ambulatory Visit: Payer: Self-pay

## 2020-08-10 ENCOUNTER — Encounter: Payer: Self-pay | Admitting: Orthopaedic Surgery

## 2020-08-10 DIAGNOSIS — M65332 Trigger finger, left middle finger: Secondary | ICD-10-CM | POA: Diagnosis not present

## 2020-08-10 DIAGNOSIS — M65351 Trigger finger, right little finger: Secondary | ICD-10-CM | POA: Insufficient documentation

## 2020-08-10 DIAGNOSIS — M65352 Trigger finger, left little finger: Secondary | ICD-10-CM | POA: Insufficient documentation

## 2020-08-10 DIAGNOSIS — M65321 Trigger finger, right index finger: Secondary | ICD-10-CM | POA: Insufficient documentation

## 2020-08-10 MED ORDER — LIDOCAINE HCL 1 % IJ SOLN
0.3000 mL | INTRAMUSCULAR | Status: AC | PRN
  Administered 2020-08-10: .3 mL

## 2020-08-10 MED ORDER — BUPIVACAINE HCL 0.5 % IJ SOLN
0.3300 mL | INTRAMUSCULAR | Status: AC | PRN
  Administered 2020-08-10: .33 mL

## 2020-08-10 MED ORDER — METHYLPREDNISOLONE ACETATE 40 MG/ML IJ SUSP
13.3300 mg | INTRAMUSCULAR | Status: AC | PRN
  Administered 2020-08-10: 13.33 mg

## 2020-08-10 NOTE — Progress Notes (Signed)
Office Visit Note   Patient: Matthew Walsh           Date of Birth: 11/18/1950           MRN: [id number] Visit Date: 08/10/2020              Requested by: Matthew Walsh, [place],  Matthew Walsh [id number] PCP: Matthew Salvage, FNP   Assessment & Plan: Visit Diagnoses:  1. Trigger little finger of right hand   2. Trigger little finger of left hand   3. Trigger finger, left middle finger     Plan: Impression is bilateral small trigger fingers and left long trigger finger.  Based on discussion patient would like to have injections for each of the fingers today.  He tolerated these injections well.  Follow-up as needed.  Follow-Up Instructions: Return if symptoms worsen or fail to improve.   Orders:  No orders of the defined types were placed in this encounter.  No orders of the defined types were placed in this encounter.     Procedures: Hand/UE Inj: bilateral small A1 for trigger finger on 08/10/2020 2:17 PM Indications: pain Details: 25 G needle Outcome: tolerated well, no immediate complications Consent was given by the patient. Patient was prepped and draped in the usual sterile fashion.   Hand/UE Inj: L long A1 for trigger finger on 08/10/2020 2:17 PM Indications: pain Details: 25 G needle Medications: 0.3 mL lidocaine 1 %; 0.33 mL bupivacaine 0.5 %; 13.33 mg methylPREDNISolone acetate 40 MG/ML Outcome: tolerated well, no immediate complications Consent was given by the patient. Patient was prepped and draped in the usual sterile fashion.       Clinical Data: No additional findings.   Subjective: Chief Complaint  Patient presents with   Left Little Finger - Pain   Right Little Finger - Pain   Left Thumb - Pain    [name] returns today for evaluation of multiple trigger fingers.  He was recently in [place] working on his house that he bought and he has had to do a lot with his hands and he is noticed symptoms reminiscent of  his recurrent trigger finger of his right long finger.  He is now experiencing triggering bilateral small fingers as well as the left long finger.  Requesting injections today.   Review of Systems   Objective: Vital Signs: There were no vitals taken for this visit.  Physical Exam  Ortho Exam Evaluation of both hands consistent with tenderness along the flexor tendons of the small fingers in the left long finger most notably at the A1 pulley.   Specialty Comments:  No specialty comments available.  Imaging: No results found.   PMFS History: Patient Active Problem List   Diagnosis Date Noted   Trigger little finger of right hand 08/10/2020   Trigger little finger of left hand 08/10/2020   Trigger finger, left middle finger 08/10/2020   Trigger finger, right middle finger 12/10/2019   Pre-diabetes 01/19/2018   Encounter for general adult medical examination with abnormal findings 01/16/2018   BPH (benign prostatic hyperplasia) 08/20/2017   Hyperlipidemia 08/20/2017   Diverticulitis 08/20/2017   GERD (gastroesophageal reflux disease) 08/20/2017   Hypertension 08/20/2017   Alcohol use 08/20/2017   PTSD (post-traumatic stress disorder) 08/20/2017   Past Medical History:  Diagnosis Date   Asthma    As a child   Diverticulitis    GERD (gastroesophageal reflux disease)    Hypertension     Family  History  Problem Relation Age of Onset   Diabetes Mother    Heart attack Mother    Miscarriages / [place] Mother    Alcohol abuse Father    Early death Father    Hearing loss Father    Heart disease Father    Hyperlipidemia Father    Hypertension Father    Alcohol abuse Brother    Alcohol abuse Maternal Grandmother    Diabetes Maternal Grandmother    Hearing loss Maternal Grandmother    Heart disease Maternal Grandmother    Hearing loss Maternal Grandfather    Heart disease Maternal Grandfather    Heart disease Paternal Grandmother     Heart disease Paternal Grandfather     Past Surgical History:  Procedure Laterality Date   HERNIA REPAIR     TONSILLECTOMY AND ADENOIDECTOMY     Social History   Occupational History   Not on file  Tobacco Use   Smoking status: Former Smoker   Smokeless tobacco: Never Used  Substance and Sexual Activity   Alcohol use: Yes    Comment: daily   Drug use: No   Sexual activity: Yes    Partners: Female

## 2020-08-20 DIAGNOSIS — L578 Other skin changes due to chronic exposure to nonionizing radiation: Secondary | ICD-10-CM | POA: Diagnosis not present

## 2020-11-07 ENCOUNTER — Other Ambulatory Visit: Payer: Self-pay | Admitting: Internal Medicine

## 2020-11-07 ENCOUNTER — Other Ambulatory Visit: Payer: Self-pay | Admitting: Family

## 2020-11-07 DIAGNOSIS — N400 Enlarged prostate without lower urinary tract symptoms: Secondary | ICD-10-CM

## 2021-02-08 ENCOUNTER — Other Ambulatory Visit: Payer: Self-pay | Admitting: Family

## 2021-02-08 DIAGNOSIS — N400 Enlarged prostate without lower urinary tract symptoms: Secondary | ICD-10-CM

## 2021-05-04 ENCOUNTER — Other Ambulatory Visit: Payer: Self-pay | Admitting: Family

## 2021-07-07 ENCOUNTER — Other Ambulatory Visit: Payer: Self-pay | Admitting: Family

## 2021-07-07 DIAGNOSIS — N400 Enlarged prostate without lower urinary tract symptoms: Secondary | ICD-10-CM

## 2021-07-07 DIAGNOSIS — F4310 Post-traumatic stress disorder, unspecified: Secondary | ICD-10-CM

## 2021-07-28 ENCOUNTER — Other Ambulatory Visit: Payer: Self-pay | Admitting: Family

## 2021-08-15 ENCOUNTER — Ambulatory Visit: Payer: Medicare Other | Admitting: Family

## 2021-08-16 ENCOUNTER — Telehealth: Payer: Self-pay

## 2021-08-16 NOTE — Telephone Encounter (Signed)
Matthew Walsh (Key: [id number]) Tamsulosin HCl 0.4MG  capsules

## 2021-08-19 ENCOUNTER — Encounter: Payer: Self-pay | Admitting: Family

## 2021-08-19 ENCOUNTER — Ambulatory Visit (INDEPENDENT_AMBULATORY_CARE_PROVIDER_SITE_OTHER): Payer: Medicare Other | Admitting: Family

## 2021-08-19 ENCOUNTER — Other Ambulatory Visit: Payer: Self-pay

## 2021-08-19 VITALS — BP 130/60 | HR 67 | Temp 97.8°F | Ht 70.0 in | Wt 188.0 lb

## 2021-08-19 DIAGNOSIS — E785 Hyperlipidemia, unspecified: Secondary | ICD-10-CM | POA: Diagnosis not present

## 2021-08-19 DIAGNOSIS — M65322 Trigger finger, left index finger: Secondary | ICD-10-CM

## 2021-08-19 DIAGNOSIS — F4310 Post-traumatic stress disorder, unspecified: Secondary | ICD-10-CM | POA: Diagnosis not present

## 2021-08-19 DIAGNOSIS — R6882 Decreased libido: Secondary | ICD-10-CM

## 2021-08-19 DIAGNOSIS — R7303 Prediabetes: Secondary | ICD-10-CM | POA: Diagnosis not present

## 2021-08-19 DIAGNOSIS — N400 Enlarged prostate without lower urinary tract symptoms: Secondary | ICD-10-CM

## 2021-08-19 DIAGNOSIS — I10 Essential (primary) hypertension: Secondary | ICD-10-CM | POA: Diagnosis not present

## 2021-08-19 DIAGNOSIS — Z23 Encounter for immunization: Secondary | ICD-10-CM | POA: Diagnosis not present

## 2021-08-19 LAB — PSA: PSA: 6.35 ng/mL — ABNORMAL HIGH (ref 0.10–4.00)

## 2021-08-19 LAB — CBC WITH DIFFERENTIAL/PLATELET
Basophils Absolute: 0 10*3/uL (ref 0.0–0.1)
Basophils Relative: 0.5 % (ref 0.0–3.0)
Eosinophils Absolute: 0.1 10*3/uL (ref 0.0–0.7)
Eosinophils Relative: 0.9 % (ref 0.0–5.0)
HCT: 42.6 % (ref 39.0–52.0)
Hemoglobin: 14.2 g/dL (ref 13.0–17.0)
Lymphocytes Relative: 29.3 % (ref 12.0–46.0)
Lymphs Abs: 1.7 10*3/uL (ref 0.7–4.0)
MCHC: 33.3 g/dL (ref 30.0–36.0)
MCV: 90.8 fl (ref 78.0–100.0)
Monocytes Absolute: 0.3 10*3/uL (ref 0.1–1.0)
Monocytes Relative: 5.8 % (ref 3.0–12.0)
Neutro Abs: 3.7 10*3/uL (ref 1.4–7.7)
Neutrophils Relative %: 63.5 % (ref 43.0–77.0)
Platelets: 197 10*3/uL (ref 150.0–400.0)
RBC: 4.69 Mil/uL (ref 4.22–5.81)
RDW: 13.4 % (ref 11.5–15.5)
WBC: 5.8 10*3/uL (ref 4.0–10.5)

## 2021-08-19 LAB — COMPREHENSIVE METABOLIC PANEL
ALT: 27 U/L (ref 0–53)
AST: 23 U/L (ref 0–37)
Albumin: 4.7 g/dL (ref 3.5–5.2)
Alkaline Phosphatase: 64 U/L (ref 39–117)
BUN: 18 mg/dL (ref 6–23)
CO2: 28 mEq/L (ref 19–32)
Calcium: 10.8 mg/dL — ABNORMAL HIGH (ref 8.4–10.5)
Chloride: 104 mEq/L (ref 96–112)
Creatinine, Ser: 1.2 mg/dL (ref 0.40–1.50)
GFR: 61.89 mL/min (ref >60.00)
Glucose, Bld: 96 mg/dL (ref 70–99)
Potassium: 4.8 mEq/L (ref 3.5–5.1)
Sodium: 140 mEq/L (ref 135–145)
Total Bilirubin: 0.8 mg/dL (ref 0.2–1.2)
Total Protein: 7.3 g/dL (ref 6.0–8.3)

## 2021-08-19 LAB — LIPID PANEL
Cholesterol: 141 mg/dL (ref 0–200)
HDL: 43.6 mg/dL (ref >39.00)
LDL Cholesterol: 75 mg/dL (ref 0–99)
NonHDL: 97.48
Total CHOL/HDL Ratio: 3
Triglycerides: 111 mg/dL (ref 0.0–149.0)
VLDL: 22.2 mg/dL (ref 0.0–40.0)

## 2021-08-19 LAB — HEMOGLOBIN A1C: Hgb A1c MFr Bld: 6 % (ref 4.6–6.5)

## 2021-08-19 MED ORDER — ROSUVASTATIN CALCIUM 20 MG PO TABS: 20.0000 mg | ORAL_TABLET | Freq: Every day | ORAL | 3 refills | Status: DC

## 2021-08-19 MED ORDER — TAMSULOSIN HCL 0.4 MG PO CAPS: 0.4000 mg | ORAL_CAPSULE | Freq: Every day | ORAL | 3 refills | Status: DC

## 2021-08-19 MED ORDER — PRAZOSIN HCL 5 MG PO CAPS: 5.0000 mg | ORAL_CAPSULE | Freq: Two times a day (BID) | ORAL | 3 refills | Status: DC

## 2021-08-19 MED ORDER — VALACYCLOVIR HCL 1 G PO TABS: ORAL_TABLET | ORAL | 1 refills | Status: DC

## 2021-08-19 MED ORDER — SERTRALINE HCL 100 MG PO TABS: 100.0000 mg | ORAL_TABLET | Freq: Every day | ORAL | 3 refills | Status: DC

## 2021-08-19 NOTE — Progress Notes (Signed)
Matthew Walsh is a 70 y.o. male with the following history as recorded in Epic[facility]:  Patient Active Problem List   Diagnosis Date Noted   Trigger little finger of right hand 08/10/2020   Trigger little finger of left hand 08/10/2020   Trigger finger, left middle finger 08/10/2020   Trigger finger, right middle finger 12/10/2019   Pre-diabetes 01/19/2018   Encounter for general adult medical examination with abnormal findings 01/16/2018   BPH (benign prostatic hyperplasia) 08/20/2017   Hyperlipidemia 08/20/2017   Diverticulitis 08/20/2017   GERD (gastroesophageal reflux disease) 08/20/2017   Hypertension 08/20/2017   Alcohol use 08/20/2017   PTSD (post-traumatic stress disorder) 08/20/2017    Current Outpatient Medications  Medication Sig Dispense Refill   meloxicam (MOBIC) 15 MG tablet Take 1 tablet (15 mg total) by mouth daily. (Patient not taking: Reported on 08/19/2021) 30 tablet 1   prazosin (MINIPRESS) 5 MG capsule Take 1 capsule (5 mg total) by mouth 2 (two) times daily. 180 capsule 3   rosuvastatin (CRESTOR) 20 MG tablet Take 1 tablet (20 mg total) by mouth daily. 90 tablet 3   sertraline (ZOLOFT) 100 MG tablet Take 1 tablet (100 mg total) by mouth daily. 90 tablet 3   tamsulosin (FLOMAX) 0.4 MG CAPS capsule Take 1 capsule (0.4 mg total) by mouth daily. 90 capsule 3   valACYclovir (VALTREX) 1000 MG tablet SMARTSIG:2 Tablet(s) By Mouth 1 to 2 Times Daily 60 tablet 1   No current facility-administered medications for this visit.    Allergies: Patient has no known allergies.  Past Medical History:  Diagnosis Date   Asthma    As a child   Diverticulitis    GERD (gastroesophageal reflux disease)    Hypertension     Past Surgical History:  Procedure Laterality Date   HERNIA REPAIR     TONSILLECTOMY AND ADENOIDECTOMY      Family History  Problem Relation Age of Onset   Diabetes Mother    Heart attack Mother    Miscarriages / Stillbirths Mother    Alcohol abuse  Father    Early death Father    Hearing loss Father    Heart disease Father    Hyperlipidemia Father    Hypertension Father    Alcohol abuse Brother    Alcohol abuse Maternal Grandmother    Diabetes Maternal Grandmother    Hearing loss Maternal Grandmother    Heart disease Maternal Grandmother    Hearing loss Maternal Grandfather    Heart disease Maternal Grandfather    Heart disease Paternal Grandmother    Heart disease Paternal Grandfather     Social History   Tobacco Use   Smoking status: Former   Smokeless tobacco: Never  Substance Use Topics   Alcohol use: Yes    Comment: daily    Subjective:  Presents for yearly exam; accompanied by his wife today; Needs referral back to orthopedist regarding trigger point finger; Notes that libido is down due to Sertraline but concerned about changing medication as he has done so well on medication;  Would like flu shot today;    Objective:  Vitals:   08/19/21 1034  BP: 130/60  Pulse: 67  Temp: 97.8 F (36.6 C)  TempSrc: Oral  SpO2: 98%  Weight: 188 lb (85.3 kg)  Height: 5' 10" (1.778 m)    General: Well developed, well nourished, in no acute distress  Skin : Warm and dry.  Head: Normocephalic and atraumatic  Eyes: Sclera and conjunctiva clear; pupils round and   reactive to light; extraocular movements intact  Ears: External normal; canals clear; tympanic membranes normal  Oropharynx: Pink, supple. No suspicious lesions  Neck: Supple without thyromegaly, adenopathy  Lungs: Respirations unlabored; clear to auscultation bilaterally without wheeze, rales, rhonchi  CVS exam: normal rate and regular rhythm.  Abdomen: Soft; nontender; nondistended; normoactive bowel sounds; no masses or hepatosplenomegaly  Musculoskeletal: No deformities; no active joint inflammation  Extremities: No edema, cyanosis, clubbing  Vessels: Symmetric bilaterally  Neurologic: Alert and oriented; speech intact; face symmetrical; moves all  extremities well; CNII-XII intact without focal deficit   Assessment:  1. Trigger index finger of left hand   2. Benign prostatic hyperplasia, unspecified whether lower urinary tract symptoms present   3. PTSD (post-traumatic stress disorder)   4. Primary hypertension   5. Hyperlipidemia, unspecified hyperlipidemia type   6. Pre-diabetes   7. Decreased libido   8. Need for immunization against influenza     Plan:  Age appropriate preventive healthcare needs addressed; encouraged regular eye doctor and dental exams; encouraged regular exercise; will update labs and refills as needed today; follow-up to be determined; Will update testosterone today- as patient has done so well on Sertraline, will consider urology referral before changing medication;   This visit occurred during the SARS-CoV-2 public health emergency.  Safety protocols were in place, including screening questions prior to the visit, additional usage of staff PPE, and extensive cleaning of exam room while observing appropriate contact time as indicated for disinfecting solutions.    No follow-ups on file.  Orders Placed This Encounter  Procedures   Flu Vaccine QUAD High Dose(Fluad)   CBC with Differential/Platelet   Comp Met (CMET)   Lipid panel   Hemoglobin A1c   PSA   Testosterone Total,Free,Bio, Males-([facility])   Ambulatory referral to Orthopedic Surgery    Referral Priority:   Routine    Referral Type:   Surgical    Referral Reason:   Specialty Services Required    Referred to Provider:   Leandrew Koyanagi, MD    Requested Specialty:   Orthopedic Surgery    Number of Visits Requested:   1    Requested Prescriptions   Signed Prescriptions Disp Refills   prazosin (MINIPRESS) 5 MG capsule 180 capsule 3    Sig: Take 1 capsule (5 mg total) by mouth 2 (two) times daily.   rosuvastatin (CRESTOR) 20 MG tablet 90 tablet 3    Sig: Take 1 tablet (20 mg total) by mouth daily.   sertraline (ZOLOFT) 100 MG tablet 90 tablet 3     Sig: Take 1 tablet (100 mg total) by mouth daily.   tamsulosin (FLOMAX) 0.4 MG CAPS capsule 90 capsule 3    Sig: Take 1 capsule (0.4 mg total) by mouth daily.   valACYclovir (VALTREX) 1000 MG tablet 60 tablet 1    Sig: SMARTSIG:2 Tablet(s) By Mouth 1 to 2 Times Daily

## 2021-08-20 LAB — TESTOSTERONE TOTAL,FREE,BIO, MALES
Albumin: 4.5 g/dL (ref 3.6–5.1)
Sex Hormone Binding: 44 nmol/L (ref 22–77)
Testosterone, Bioavailable: 118.7 ng/dL (ref 110.0–575.0)
Testosterone, Free: 57.7 pg/mL (ref 46.0–224.0)
Testosterone: 542 ng/dL (ref 250–827)

## 2021-08-22 DIAGNOSIS — D225 Melanocytic nevi of trunk: Secondary | ICD-10-CM | POA: Diagnosis not present

## 2021-08-22 DIAGNOSIS — D1801 Hemangioma of skin and subcutaneous tissue: Secondary | ICD-10-CM | POA: Diagnosis not present

## 2021-08-22 DIAGNOSIS — L570 Actinic keratosis: Secondary | ICD-10-CM | POA: Diagnosis not present

## 2021-08-22 DIAGNOSIS — L738 Other specified follicular disorders: Secondary | ICD-10-CM | POA: Diagnosis not present

## 2021-08-22 DIAGNOSIS — D2272 Melanocytic nevi of left lower limb, including hip: Secondary | ICD-10-CM | POA: Diagnosis not present

## 2021-08-22 DIAGNOSIS — D2271 Melanocytic nevi of right lower limb, including hip: Secondary | ICD-10-CM | POA: Diagnosis not present

## 2021-08-22 DIAGNOSIS — L821 Other seborrheic keratosis: Secondary | ICD-10-CM | POA: Diagnosis not present

## 2021-08-22 DIAGNOSIS — B0089 Other herpesviral infection: Secondary | ICD-10-CM | POA: Diagnosis not present

## 2021-08-23 ENCOUNTER — Other Ambulatory Visit: Payer: Self-pay | Admitting: Family

## 2021-08-23 ENCOUNTER — Ambulatory Visit (INDEPENDENT_AMBULATORY_CARE_PROVIDER_SITE_OTHER): Payer: Medicare Other | Admitting: Orthopaedic Surgery

## 2021-08-23 ENCOUNTER — Encounter: Payer: Self-pay | Admitting: Orthopaedic Surgery

## 2021-08-23 ENCOUNTER — Other Ambulatory Visit: Payer: Self-pay

## 2021-08-23 DIAGNOSIS — R972 Elevated prostate specific antigen [PSA]: Secondary | ICD-10-CM

## 2021-08-23 DIAGNOSIS — M65322 Trigger finger, left index finger: Secondary | ICD-10-CM | POA: Diagnosis not present

## 2021-08-23 DIAGNOSIS — R6882 Decreased libido: Secondary | ICD-10-CM

## 2021-08-23 MED ORDER — METHYLPREDNISOLONE ACETATE 40 MG/ML IJ SUSP
13.3300 mg | INTRAMUSCULAR | Status: AC | PRN
Start: 2021-08-23 — End: 2021-08-23
  Administered 2021-08-23: 13.33 mg

## 2021-08-23 MED ORDER — BUPIVACAINE HCL 0.5 % IJ SOLN
0.3300 mL | INTRAMUSCULAR | Status: AC | PRN
Start: 2021-08-23 — End: 2021-08-23
  Administered 2021-08-23: .33 mL

## 2021-08-23 MED ORDER — LIDOCAINE HCL 1 % IJ SOLN
0.3000 mL | INTRAMUSCULAR | Status: AC | PRN
Start: 2021-08-23 — End: 2021-08-23
  Administered 2021-08-23: .3 mL

## 2021-08-23 NOTE — Progress Notes (Signed)
Office Visit Note   Patient: Matthew Walsh           Date of Birth: 28-Jun-1951           MRN: [id number] Visit Date: 08/23/2021              Requested by: [name], [address] [place],  [place] [address] PCP: [name], FNP   Assessment & Plan: Visit Diagnoses:  1. Trigger index finger of left hand     Plan: Trigger finger injection was performed for the left index finger today.  He tolerated this well.  We will see him back as needed.  Follow-Up Instructions: No follow-ups on file.   Orders:  No orders of the defined types were placed in this encounter.  No orders of the defined types were placed in this encounter.     Procedures: Hand/UE Inj: L index A1 for trigger finger on 08/23/2021 8:21 AM Indications: pain Details: 25 G needle Medications: 0.3 mL lidocaine 1 %; 0.33 mL bupivacaine 0.5 %; 13.33 mg methylPREDNISolone acetate 40 MG/ML Outcome: tolerated well, no immediate complications Consent was given by the patient. Patient was prepped and draped in the usual sterile fashion.      Clinical Data: No additional findings.   Subjective: Chief Complaint  Patient presents with   Left Index Finger - Pain    HPI  [name] returns today for left index trigger finger.  I have seen him in the past for multiple other trigger fingers.  These have responded well to injections.  He is requesting injection today.  He is doing a lot with his hands because he is renovating a house in [place].  Review of Systems   Objective: Vital Signs: There were no vitals taken for this visit.  Physical Exam  Ortho Exam  Examination of the left hand shows tender nodule in the flexor palmar crease in line with the index finger.  There is triggering with flexion of the index finger.  Specialty Comments:  No specialty comments available.  Imaging: No results found.   PMFS History: Patient Active Problem List   Diagnosis  Date Noted   Trigger little finger of right hand 08/10/2020   Trigger little finger of left hand 08/10/2020   Trigger finger, left middle finger 08/10/2020   Trigger finger, right middle finger 12/10/2019   Pre-diabetes 01/19/2018   Encounter for general adult medical examination with abnormal findings 01/16/2018   BPH (benign prostatic hyperplasia) 08/20/2017   Hyperlipidemia 08/20/2017   Diverticulitis 08/20/2017   GERD (gastroesophageal reflux disease) 08/20/2017   Hypertension 08/20/2017   Alcohol use 08/20/2017   PTSD (post-traumatic stress disorder) 08/20/2017   Past Medical History:  Diagnosis Date   Asthma    As a child   Diverticulitis    GERD (gastroesophageal reflux disease)    Hypertension     Family History  Problem Relation Age of Onset   Diabetes Mother    Heart attack Mother    Miscarriages / [place] Mother    Alcohol abuse Father    Early death Father    Hearing loss Father    Heart disease Father    Hyperlipidemia Father    Hypertension Father    Alcohol abuse Brother    Alcohol abuse Maternal Grandmother    Diabetes Maternal Grandmother    Hearing loss Maternal Grandmother    Heart disease Maternal Grandmother    Hearing loss Maternal Grandfather  Heart disease Maternal Grandfather    Heart disease Paternal Grandmother    Heart disease Paternal Grandfather     Past Surgical History:  Procedure Laterality Date   HERNIA REPAIR     TONSILLECTOMY AND ADENOIDECTOMY     Social History   Occupational History   Not on file  Tobacco Use   Smoking status: Former   Smokeless tobacco: Never  Substance and Sexual Activity   Alcohol use: Yes    Comment: daily   Drug use: No   Sexual activity: Yes    Partners: Female

## 2021-09-13 DIAGNOSIS — Z23 Encounter for immunization: Secondary | ICD-10-CM | POA: Diagnosis not present

## 2021-09-21 ENCOUNTER — Other Ambulatory Visit: Payer: Medicare Other

## 2021-09-23 LAB — PTH, INTACT AND CALCIUM
Calcium: 10.4 mg/dL — ABNORMAL HIGH (ref 8.6–10.3)
PTH: 134 pg/mL — ABNORMAL HIGH (ref 16–77)

## 2021-09-27 ENCOUNTER — Encounter: Payer: Self-pay | Admitting: Family

## 2021-09-27 ENCOUNTER — Other Ambulatory Visit: Payer: Self-pay | Admitting: Family

## 2021-09-27 DIAGNOSIS — R7989 Other specified abnormal findings of blood chemistry: Secondary | ICD-10-CM

## 2021-10-10 DIAGNOSIS — R972 Elevated prostate specific antigen [PSA]: Secondary | ICD-10-CM | POA: Diagnosis not present

## 2021-11-01 NOTE — Progress Notes (Signed)
Name: Matthew Walsh  MRN/ DOB: [id number], September 04, 1951    Age/ Sex: 71 y.o., male    PCP: [name], FNP   Reason for Endocrinology Evaluation: Hyperparathyroidism      Date of Initial Endocrinology Evaluation: 11/02/2021     HPI: Matthew Walsh is a 71 y.o. male with a past medical history of Dyslipidemia and BPH. The patient presented for initial endocrinology clinic visit on 11/02/2021 for consultative assistance with his Hyperparathyroidism .   Matthew Walsh indicates that he was first diagnosed with hypercalcemia in 2018,with a max serum level of  11.1 mg/dL in 07/2019 ( uncorrected) and PTH level of 134 pg/mL. Since that time, he has experienced symptoms of  polydipsia, but no polyuria, or constipation     He stopped  use of over the counter calcium (including supplements, Tums, Rolaids, or other calcium containing antacids) a month ago , denies lithium, or HCTZ He stopped Vitamin D a month ago   He denies  history of kidney stones, kidney disease, liver disease, granulomatous disease. He denies  osteoporosis or prior fractures except a broken finger at age 89. Daily dietary calcium intake: 1-2 servings . He denies family history of osteoporosis, parathyroid disease. Mother with  thyroid disease.    HISTORY:  Past Medical History:  Past Medical History:  Diagnosis Date   Asthma    As a child   Diverticulitis    GERD (gastroesophageal reflux disease)    Hypertension    Past Surgical History:  Past Surgical History:  Procedure Laterality Date   HERNIA REPAIR     TONSILLECTOMY AND ADENOIDECTOMY      Social History:  reports that he has quit smoking. He has never used smokeless tobacco. He reports current alcohol use. He reports that he does not use drugs. Family History: family history includes Alcohol abuse in his brother, father, and maternal grandmother; Diabetes in his maternal grandmother and mother; Early death in his father; Hearing loss in his  father, maternal grandfather, and maternal grandmother; Heart attack in his mother; Heart disease in his father, maternal grandfather, maternal grandmother, paternal grandfather, and paternal grandmother; Hyperlipidemia in his father; Hypertension in his father; Miscarriages / [place] in his mother.   HOME MEDICATIONS: Allergies as of 11/02/2021   No Known Allergies      Medication List        Accurate as of November 02, 2021  9:39 AM. If you have any questions, ask your nurse or doctor.          STOP taking these medications    meloxicam 15 MG tablet Commonly known as: MOBIC Stopped by: Dorita Sciara, MD       TAKE these medications    prazosin 5 MG capsule Commonly known as: MINIPRESS Take 1 capsule (5 mg total) by mouth 2 (two) times daily.   rosuvastatin 20 MG tablet Commonly known as: CRESTOR Take 1 tablet (20 mg total) by mouth daily.   sertraline 100 MG tablet Commonly known as: ZOLOFT Take 1 tablet (100 mg total) by mouth daily.   tamsulosin 0.4 MG Caps capsule Commonly known as: FLOMAX Take 1 capsule (0.4 mg total) by mouth daily.   valACYclovir 1000 MG tablet Commonly known as: VALTREX SMARTSIG:2 Tablet(s) By Mouth 1 to 2 Times Daily          REVIEW OF SYSTEMS: A comprehensive ROS was conducted with the patient and is negative except as per HPI      OBJECTIVE:  VS: BP (!) 150/90 (BP Location: Left Arm, Patient Position: Sitting, Cuff Size: Small)    Pulse 64    Ht 5\' 10"  (1.778 m)    Wt 195 lb 9.6 oz (88.7 kg)    SpO2 96%    BMI 28.07 kg/m    Wt Readings from Last 3 Encounters:  11/02/21 195 lb 9.6 oz (88.7 kg)  08/19/21 188 lb (85.3 kg)  07/16/20 183 lb (83 kg)     EXAM: General: Pt appears well and is in NAD  Hydration: Well-hydrated with moist mucous membranes and good skin turgor  Neck: General: Supple without adenopathy. Thyroid: Thyroid size normal.  No goiter or nodules appreciated.   Lungs: Clear with good BS bilat  with no rales, rhonchi, or wheezes  Heart: Auscultation: RRR.  Abdomen: Normoactive bowel sounds, soft, nontender, without masses or organomegaly palpable  Extremities:  BL LE: No pretibial edema normal ROM and strength.  Mental Status: Judgment, insight: Intact Orientation: Oriented to time, place, and person Mood and affect: No depression, anxiety, or agitation     DATA REVIEWED:  Latest Reference Range & Units 11/02/21 10:02  Sodium 135 - 145 mEq/L 139  Potassium 3.5 - 5.1 mEq/L 4.8  Chloride 96 - 112 mEq/L 106  CO2 19 - 32 mEq/L 29  Glucose 70 - 99 mg/dL 100 (H)  BUN 6 - 23 mg/dL 21  Creatinine 0.40 - 1.50 mg/dL 1.23  Calcium 8.4 - 10.5 mg/dL 10.9 (H)  Albumin 3.5 - 5.2 g/dL 4.8  GFR >60.00 mL/min 60.00 (L)  VITD 30.00 - 100.00 ng/mL 34.92      ASSESSMENT/PLAN/RECOMMENDATIONS:   Hyperparathyroidism:  -Patient is asymptomatic -We discussed differential of familial hypercalcinuric hypercalcemia ([place]) vs Primary Hyperparathyroidism (pHPT)  - It is important to differentiate between the two, as Matthew Walsh does not cause any organ damage and does not require further follow up , on the other hand pHPT could cause end organ damage and would require further evaluation.  -We will proceed with bone density -We will proceed with 24-hour urine collection for calcium -He had already stopped taking OTC calcium tablets    Recommendations Stay hydrated Avoid over-the-counter calcium tablets Consume 2-3 servings of dietary calcium a day Vitamin D3 800 IU daily    Follow-up in 4 months  Signed electronically by: Mack Guise, MD  [facility]  [facility] [address] [place], [place] [address] Phone: [phone] FAX: [phone]   CC: [name], [address] [place] [address] Phone: [phone] Fax: [phone]   Return to Endocrinology clinic as below: Future Appointments  Date  Time Provider [place]  08/22/2022  8:20 AM [name], FNP [facility]

## 2021-11-02 ENCOUNTER — Other Ambulatory Visit: Payer: Self-pay

## 2021-11-02 ENCOUNTER — Ambulatory Visit (INDEPENDENT_AMBULATORY_CARE_PROVIDER_SITE_OTHER): Payer: Medicare Other | Admitting: Internal Medicine

## 2021-11-02 ENCOUNTER — Encounter: Payer: Self-pay | Admitting: Internal Medicine

## 2021-11-02 VITALS — BP 150/90 | HR 64 | Ht 70.0 in | Wt 195.6 lb

## 2021-11-02 DIAGNOSIS — E213 Hyperparathyroidism, unspecified: Secondary | ICD-10-CM | POA: Diagnosis not present

## 2021-11-02 LAB — BASIC METABOLIC PANEL
BUN: 21 mg/dL (ref 6–23)
CO2: 29 mEq/L (ref 19–32)
Calcium: 10.9 mg/dL — ABNORMAL HIGH (ref 8.4–10.5)
Chloride: 106 mEq/L (ref 96–112)
Creatinine, Ser: 1.23 mg/dL (ref 0.40–1.50)
GFR: 60 mL/min — ABNORMAL LOW (ref >60.00)
Glucose, Bld: 100 mg/dL — ABNORMAL HIGH (ref 70–99)
Potassium: 4.8 mEq/L (ref 3.5–5.1)
Sodium: 139 mEq/L (ref 135–145)

## 2021-11-02 LAB — ALBUMIN: Albumin: 4.8 g/dL (ref 3.5–5.2)

## 2021-11-02 LAB — VITAMIN D 25 HYDROXY (VIT D DEFICIENCY, FRACTURES): VITD: 34.92 ng/mL (ref 30.00–100.00)

## 2021-11-02 NOTE — Patient Instructions (Addendum)
-   Stay Hydrated  - Avoid Over the counter calcium tablets  - Consume 2-3 servings of dietary calcium a day ( low fat dairy, green leafy vegetables etc)    24-Hour Urine Collection  You will be collecting your urine for a 24-hour period of time. Your timer starts with your first urine of the morning (For example - If you first pee at [place], your timer will start at [place]) [place] your first urine of the morning Collect your urine every time you pee for the next 24 hours STOP your urine collection 24 hours after you started the collection (For example - You would stop at 9AM the day after you started)     Bone density will be at [facility] office  located at [address]. [employer]

## 2021-11-03 LAB — PARATHYROID HORMONE, INTACT (NO CA): PTH: 87 pg/mL — ABNORMAL HIGH (ref 16–77)

## 2021-11-03 LAB — CALCIUM, IONIZED: Calcium, Ion: 5.8 mg/dL — ABNORMAL HIGH (ref 4.8–5.6)

## 2021-11-07 ENCOUNTER — Other Ambulatory Visit: Payer: Self-pay

## 2021-11-07 ENCOUNTER — Ambulatory Visit (INDEPENDENT_AMBULATORY_CARE_PROVIDER_SITE_OTHER)
Admission: RE | Admit: 2021-11-07 | Discharge: 2021-11-07 | Disposition: A | Discharge status: Home / Self Care | Payer: Medicare Other | Source: Ambulatory Visit | Attending: Internal Medicine | Admitting: Internal Medicine

## 2021-11-07 DIAGNOSIS — E213 Hyperparathyroidism, unspecified: Secondary | ICD-10-CM | POA: Diagnosis not present

## 2021-11-09 ENCOUNTER — Other Ambulatory Visit (INDEPENDENT_AMBULATORY_CARE_PROVIDER_SITE_OTHER): Payer: Medicare Other

## 2021-11-09 ENCOUNTER — Other Ambulatory Visit: Payer: Self-pay

## 2021-11-09 DIAGNOSIS — E213 Hyperparathyroidism, unspecified: Secondary | ICD-10-CM

## 2021-11-10 LAB — EXTRA URINE SPECIMEN

## 2021-11-10 LAB — CREATININE, URINE, 24 HOUR: Creatinine, 24H Ur: 1.88 g/(24.h) (ref 0.50–2.15)

## 2021-11-10 LAB — CALCIUM, URINE, 24 HOUR: Calcium, 24H Urine: 168 mg/24 h

## 2021-11-11 ENCOUNTER — Other Ambulatory Visit: Payer: Self-pay | Admitting: Urology

## 2021-11-11 DIAGNOSIS — R972 Elevated prostate specific antigen [PSA]: Secondary | ICD-10-CM

## 2021-12-06 ENCOUNTER — Inpatient Hospital Stay: Admission: RE | Admit: 2021-12-06 | Payer: Medicare Other | Source: Ambulatory Visit

## 2021-12-14 ENCOUNTER — Other Ambulatory Visit: Payer: Self-pay

## 2021-12-14 ENCOUNTER — Ambulatory Visit
Admission: RE | Admit: 2021-12-14 | Discharge: 2021-12-14 | Disposition: A | Discharge status: Home / Self Care | Payer: Medicare Other | Source: Ambulatory Visit | Attending: Urology | Admitting: Urology

## 2021-12-14 DIAGNOSIS — R972 Elevated prostate specific antigen [PSA]: Secondary | ICD-10-CM

## 2021-12-14 DIAGNOSIS — N401 Enlarged prostate with lower urinary tract symptoms: Secondary | ICD-10-CM | POA: Diagnosis not present

## 2021-12-14 MED ORDER — GADOBENATE DIMEGLUMINE 529 MG/ML IV SOLN
18.0000 mL | Freq: Once | INTRAVENOUS | Status: AC | PRN
  Administered 2021-12-14: 18 mL via INTRAVENOUS

## 2022-01-02 DIAGNOSIS — R972 Elevated prostate specific antigen [PSA]: Secondary | ICD-10-CM | POA: Diagnosis not present

## 2022-01-05 DIAGNOSIS — R972 Elevated prostate specific antigen [PSA]: Secondary | ICD-10-CM | POA: Diagnosis not present

## 2022-01-12 ENCOUNTER — Telehealth: Payer: Self-pay | Admitting: Family

## 2022-01-12 NOTE — Telephone Encounter (Signed)
Left message for patient to call back and schedule Medicare Annual Wellness Visit (AWV) in office.  ° °If not able to come in office, please offer to do virtually or by telephone.  Left office number and my jabber #[phone]. ° °Due for AWVI ° °Please schedule at anytime with Nurse Health Advisor. °  °

## 2022-01-16 ENCOUNTER — Ambulatory Visit (INDEPENDENT_AMBULATORY_CARE_PROVIDER_SITE_OTHER): Payer: Medicare Other

## 2022-01-16 DIAGNOSIS — Z0000 Encounter for general adult medical examination without abnormal findings: Secondary | ICD-10-CM

## 2022-01-16 NOTE — Progress Notes (Addendum)
? ?Subjective:  ? Matthew Walsh is a 71 y.o. male who presents for an Initial Medicare Annual Wellness Visit. ? ?I connected with  Matthew Walsh on 01/16/22 by a audio enabled telemedicine application and verified that I am speaking with the correct person using two identifiers. ? ?Patient Location: Home ? ?Provider Location: Office/Clinic ? ?I discussed the limitations of evaluation and management by telemedicine. The patient expressed understanding and agreed to proceed.  ? ?Review of Systems    ? ?Cardiac Risk Factors include: advanced age (>28mn, >>4women);hypertension;dyslipidemia ? ?   ?Objective:  ?  ?There were no vitals filed for this visit. ?There is no height or weight on file to calculate BMI. ? ?   ? View : No data to display.  ?  ?  ?  ? ? ?Current Medications (verified) ?Outpatient Encounter Medications as of 01/16/2022  ?Medication Sig  ? prazosin (MINIPRESS) 5 MG capsule Take 1 capsule (5 mg total) by mouth 2 (two) times daily.  ? rosuvastatin (CRESTOR) 20 MG tablet Take 1 tablet (20 mg total) by mouth daily.  ? sertraline (ZOLOFT) 100 MG tablet Take 1 tablet (100 mg total) by mouth daily.  ? tamsulosin (FLOMAX) 0.4 MG CAPS capsule Take 1 capsule (0.4 mg total) by mouth daily.  ? valACYclovir (VALTREX) 1000 MG tablet SMARTSIG:2 Tablet(s) By Mouth 1 to 2 Times Daily  ? ?No facility-administered encounter medications on file as of 01/16/2022.  ? ? ?Allergies (verified) ?Patient has no known allergies.  ? ?History: ?Past Medical History:  ?Diagnosis Date  ? Asthma   ? As a child  ? Diverticulitis   ? GERD (gastroesophageal reflux disease)   ? Hypertension   ? ?Past Surgical History:  ?Procedure Laterality Date  ? HERNIA REPAIR    ? TONSILLECTOMY AND ADENOIDECTOMY    ? ?Family History  ?Problem Relation Age of Onset  ? Diabetes Mother   ? Heart attack Mother   ? Miscarriages / [place]   ? Alcohol abuse Father   ? Early death Father   ? Hearing loss Father   ? Heart disease Father   ?  Hyperlipidemia Father   ? Hypertension Father   ? Alcohol abuse Brother   ? Alcohol abuse Maternal Grandmother   ? Diabetes Maternal Grandmother   ? Hearing loss Maternal Grandmother   ? Heart disease Maternal Grandmother   ? Hearing loss Maternal Grandfather   ? Heart disease Maternal Grandfather   ? Heart disease Paternal Grandmother   ? Heart disease Paternal Grandfather   ? ?Social History  ? ?Socioeconomic History  ? Marital status: Married  ?  Spouse name: Not on file  ? Number of children: Not on file  ? Years of education: Not on file  ? Highest education level: Not on file  ?Occupational History  ? Not on file  ?Tobacco Use  ? Smoking status: Former  ? Smokeless tobacco: Never  ?Substance and Sexual Activity  ? Alcohol use: Yes  ?  Comment: daily  ? Drug use: No  ? Sexual activity: Yes  ?  Partners: Female  ?Other Topics Concern  ? Not on file  ?Social History Narrative  ? Fun- retired, just moved to area.  ? Neighborhood clubs  ? ?Social Determinants of Health  ? ?Financial Resource Strain: Low Risk   ? Difficulty of Paying Living Expenses: Not hard at all  ?Food Insecurity: No Food Insecurity  ? Worried About RCharity fundraiserin the Last Year:  Never true  ? Ran Out of Food in the Last Year: Never true  ?Transportation Needs: No Transportation Needs  ? Lack of Transportation (Medical): No  ? Lack of Transportation (Non-Medical): No  ?Physical Activity: Not on file  ?Stress: No Stress Concern Present  ? Feeling of Stress : Not at all  ?Social Connections: Moderately Isolated  ? Frequency of Communication with Friends and Family: More than three times a week  ? Frequency of Social Gatherings with Friends and Family: More than three times a week  ? Attends Religious Services: Never  ? Active Member of Clubs or Organizations: No  ? Attends Archivist Meetings: Never  ? Marital Status: Married  ? ? ?Tobacco Counseling ?Counseling given: Not Answered ? ? ?Clinical Intake: ? ?Pre-visit preparation  completed: Yes ? ?Pain : No/denies pain ? ?  ? ?Nutritional Risks: None ?Diabetes: No ?CBG done?: No ?Did pt. bring in CBG monitor from home?: No ? ?How often do you need to have someone help you when you read instructions, pamphlets, or other written materials from your doctor or pharmacy?: 1 - Never ? ?Diabetic?No ? ?Interpreter Needed?: No ? ?Information entered by :: Matthew Walsh ? ? ?Activities of Daily Living ? ?  01/16/2022  ?  9:47 AM  ?In your present state of health, do you have any difficulty performing the following activities:  ?Hearing? 0  ?Vision? 0  ?Difficulty concentrating or making decisions? 0  ?Walking or climbing stairs? 0  ?Dressing or bathing? 0  ?Doing errands, shopping? 0  ?Preparing Food and eating ? N  ?Using the Toilet? N  ?In the past six months, have you accidently leaked urine? N  ?Do you have problems with loss of bowel control? N  ?Managing your Medications? N  ?Managing your Finances? N  ?Housekeeping or managing your Housekeeping? N  ? ? ?Patient Care Team: ?[name], FNP as PCP - General (Internal Medicine) ? ?Indicate any recent Medical Services you may have received from other than [facility] providers in the past year (date may be approximate). ? ?   ?Assessment:  ? This is a routine wellness examination for Matthew Walsh. ? ?Hearing/Vision screen ?No results found. ? ?Dietary issues and exercise activities discussed: ?Current Exercise Habits: Home exercise routine, Type of exercise: stretching;walking, Time (Minutes): 60, Frequency (Times/Week): 7, Weekly Exercise (Minutes/Week): 420, Intensity: Mild, Exercise limited by: None identified ? ? Goals Addressed   ?None ?  ? ?Depression Screen ? ?  01/16/2022  ?  9:46 AM 08/19/2021  ? 10:41 AM 12/01/2019  ?  9:58 AM 08/20/2017  ?  2:18 PM  ?PHQ 2/9 Scores  ?PHQ - 2 Score 0 0 0 0  ?  ?Fall Risk ? ?  01/16/2022  ?  9:45 AM 08/19/2021  ? 10:40 AM 08/20/2017  ?  2:18 PM  ?Fall Risk   ?Falls in the past year? 0 0 No  ?Number falls in  past yr: 0 0   ?Injury with Fall? 0 0   ?Risk for fall due to : No Fall Risks No Fall Risks   ?Follow up Falls evaluation completed Falls evaluation completed   ? ? ?FALL RISK PREVENTION PERTAINING TO THE HOME: ? ?Any stairs in or around the home? Yes  ?If so, are there any without handrails? No  ?Home free of loose throw rugs in walkways, pet beds, electrical cords, etc? Yes  ?Adequate lighting in your home to reduce risk of falls? Yes  ? ?ASSISTIVE DEVICES  UTILIZED TO PREVENT FALLS: ? ?Life alert? No  ?Use of a cane, walker or w/c? No  ?Grab bars in the bathroom? Yes  ?Shower chair or bench in shower? No  ?Elevated toilet seat or a handicapped toilet? No  ? ?TIMED UP AND GO: ? ?Was the test performed? No .  ? ? ?Cognitive Function: ?  ?  ? ?  01/16/2022  ?  9:48 AM  ?6CIT Screen  ?What time? 0 points  ?Count back from 20 0 points  ?Months in reverse 0 points  ?Repeat phrase 0 points  ? ? ?Immunizations ?Immunization History  ?Administered Date(s) Administered  ? Fluad Quad(high Dose 65+) 08/19/2021  ? Influenza, High Dose Seasonal PF 05/23/2019, 07/02/2020  ? PFIZER(Purple Top)SARS-COV-2 Vaccination 11/09/2019, 12/04/2019, 07/02/2020  ? Pension scheme manager 41yr & up 09/13/2021  ? Pneumococcal Conjugate-13 01/16/2018  ? Pneumococcal Polysaccharide-23 03/04/2019  ? Zoster Recombinat (Shingrix) 05/23/2019, 07/22/2019  ? ? ?TDAP status: Due, Education has been provided regarding the importance of this vaccine. Advised may receive this vaccine at local pharmacy or Health Dept. Aware to provide a copy of the vaccination record if obtained from local pharmacy or Health Dept. Verbalized acceptance and understanding. ? ?Flu Vaccine status: Up to date ? ?Pneumococcal vaccine status: Up to date ? ?Covid-19 vaccine status: Completed vaccines ? ?Qualifies for Shingles Vaccine? Yes   ?Zostavax completed No   ?Shingrix Completed?: Yes ? ?Screening Tests ?Health Maintenance  ?Topic Date Due  ? TETANUS/TDAP   10/02/2021  ? INFLUENZA VACCINE  05/02/2022  ? COLONOSCOPY (Pts 45-445yrInsurance coverage will need to be confirmed)  10/03/2023  ? Pneumonia Vaccine 6561Years old  Completed  ? COVID-19 Vaccine  Complete

## 2022-01-16 NOTE — Patient Instructions (Signed)
Mr. Nabers , ?Thank you for taking time to come for your Medicare Wellness Visit. I appreciate your ongoing commitment to your health goals. Please review the following plan we discussed and let me know if I can assist you in the future.  ? ?Screening recommendations/referrals: ?Colonoscopy: 10/02/13 due 10/03/23 ?Recommended yearly ophthalmology/optometry visit for glaucoma screening and checkup ?Recommended yearly dental visit for hygiene and checkup ? ?Vaccinations: ?Influenza vaccine: up to date ?Pneumococcal vaccine: up to date ?Tdap vaccine: Due-May obtain vaccine at your local pharmacy.  ?Shingles vaccine: up to date   ?Covid-19: completed ? ?Advanced directives: no, packet sent ? ?Conditions/risks identified: see problem list ? ?Next appointment: Follow up in one year for your annual wellness visit. 01/18/23 ? ?Preventive Care 70 Years and Older, Male ?Preventive care refers to lifestyle choices and visits with your health care provider that can promote health and wellness. ?What does preventive care include? ?A yearly physical exam. This is also called an annual well check. ?Dental exams once or twice a year. ?Routine eye exams. Ask your health care provider how often you should have your eyes checked. ?Personal lifestyle choices, including: ?Daily care of your teeth and gums. ?Regular physical activity. ?Eating a healthy diet. ?Avoiding tobacco and drug use. ?Limiting alcohol use. ?Practicing safe sex. ?Taking low doses of aspirin every day. ?Taking vitamin and mineral supplements as recommended by your health care provider. ?What happens during an annual well check? ?The services and screenings done by your health care provider during your annual well check will depend on your age, overall health, lifestyle risk factors, and family history of disease. ?Counseling  ?Your health care provider may ask you questions about your: ?Alcohol use. ?Tobacco use. ?Drug use. ?Emotional well-being. ?Home and relationship  well-being. ?Sexual activity. ?Eating habits. ?History of falls. ?Memory and ability to understand (cognition). ?Work and work Statistician. ?Screening  ?You may have the following tests or measurements: ?Height, weight, and BMI. ?Blood pressure. ?Lipid and cholesterol levels. These may be checked every 5 years, or more frequently if you are over 55 years old. ?Skin check. ?Lung cancer screening. You may have this screening every year starting at age 65 if you have a 30-pack-year history of smoking and currently smoke or have quit within the past 15 years. ?Fecal occult blood test (FOBT) of the stool. You may have this test every year starting at age 27. ?Flexible sigmoidoscopy or colonoscopy. You may have a sigmoidoscopy every 5 years or a colonoscopy every 10 years starting at age 63. ?Prostate cancer screening. Recommendations will vary depending on your family history and other risks. ?Hepatitis C blood test. ?Hepatitis B blood test. ?Sexually transmitted disease (STD) testing. ?Diabetes screening. This is done by checking your blood sugar (glucose) after you have not eaten for a while (fasting). You may have this done every 1-3 years. ?Abdominal aortic aneurysm (AAA) screening. You may need this if you are a current or former smoker. ?Osteoporosis. You may be screened starting at age 47 if you are at high risk. ?Talk with your health care provider about your test results, treatment options, and if necessary, the need for more tests. ?Vaccines  ?Your health care provider may recommend certain vaccines, such as: ?Influenza vaccine. This is recommended every year. ?Tetanus, diphtheria, and acellular pertussis (Tdap, Td) vaccine. You may need a Td booster every 10 years. ?Zoster vaccine. You may need this after age 81. ?Pneumococcal 13-valent conjugate (PCV13) vaccine. One dose is recommended after age 50. ?Pneumococcal polysaccharide (PPSV23) vaccine.  One dose is recommended after age 41. ?Talk to your health care  provider about which screenings and vaccines you need and how often you need them. ?This information is not intended to replace advice given to you by your health care provider. Make sure you discuss any questions you have with your health care provider. ?Document Released: 10/15/2015 Document Revised: 06/07/2016 Document Reviewed: 07/20/2015 ?Elsevier Interactive Patient Education ? 2017 [employer]. ? ?Fall Prevention in the Home ?Falls can cause injuries. They can happen to people of all ages. There are many things you can do to make your home safe and to help prevent falls. ?What can I do on the outside of my home? ?Regularly fix the edges of walkways and driveways and fix any cracks. ?Remove anything that might make you trip as you walk through a door, such as a raised step or threshold. ?Trim any bushes or trees on the path to your home. ?Use bright outdoor lighting. ?Clear any walking paths of anything that might make someone trip, such as rocks or tools. ?Regularly check to see if handrails are loose or broken. Make sure that both sides of any steps have handrails. ?Any raised decks and porches should have guardrails on the edges. ?Have any leaves, snow, or ice cleared regularly. ?Use sand or salt on walking paths during winter. ?Clean up any spills in your garage right away. This includes oil or grease spills. ?What can I do in the bathroom? ?Use night lights. ?Install grab bars by the toilet and in the tub and shower. Do not use towel bars as grab bars. ?Use non-skid mats or decals in the tub or shower. ?If you need to sit down in the shower, use a plastic, non-slip stool. ?Keep the floor dry. Clean up any water that spills on the floor as soon as it happens. ?Remove soap buildup in the tub or shower regularly. ?Attach bath mats securely with double-sided non-slip rug tape. ?Do not have throw rugs and other things on the floor that can make you trip. ?What can I do in the bedroom? ?Use night lights. ?Make  sure that you have a light by your bed that is easy to reach. ?Do not use any sheets or blankets that are too big for your bed. They should not hang down onto the floor. ?Have a firm chair that has side arms. You can use this for support while you get dressed. ?Do not have throw rugs and other things on the floor that can make you trip. ?What can I do in the kitchen? ?Clean up any spills right away. ?Avoid walking on wet floors. ?Keep items that you use a lot in easy-to-reach places. ?If you need to reach something above you, use a strong step stool that has a grab bar. ?Keep electrical cords out of the way. ?Do not use floor polish or wax that makes floors slippery. If you must use wax, use non-skid floor wax. ?Do not have throw rugs and other things on the floor that can make you trip. ?What can I do with my stairs? ?Do not leave any items on the stairs. ?Make sure that there are handrails on both sides of the stairs and use them. Fix handrails that are broken or loose. Make sure that handrails are as long as the stairways. ?Check any carpeting to make sure that it is firmly attached to the stairs. Fix any carpet that is loose or worn. ?Avoid having throw rugs at the top or bottom of  the stairs. If you do have throw rugs, attach them to the floor with carpet tape. ?Make sure that you have a light switch at the top of the stairs and the bottom of the stairs. If you do not have them, ask someone to add them for you. ?What else can I do to help prevent falls? ?Wear shoes that: ?Do not have high heels. ?Have rubber bottoms. ?Are comfortable and fit you well. ?Are closed at the toe. Do not wear sandals. ?If you use a stepladder: ?Make sure that it is fully opened. Do not climb a closed stepladder. ?Make sure that both sides of the stepladder are locked into place. ?Ask someone to hold it for you, if possible. ?Clearly mark and make sure that you can see: ?Any grab bars or handrails. ?First and last steps. ?Where the  edge of each step is. ?Use tools that help you move around (mobility aids) if they are needed. These include: ?Canes. ?Walkers. ?Scooters. ?Crutches. ?Turn on the lights when you go into a dark area. R

## 2022-01-26 ENCOUNTER — Encounter: Payer: Self-pay | Admitting: Internal Medicine

## 2022-01-26 ENCOUNTER — Ambulatory Visit (INDEPENDENT_AMBULATORY_CARE_PROVIDER_SITE_OTHER): Payer: Medicare Other | Admitting: Internal Medicine

## 2022-01-26 VITALS — BP 140/76 | HR 60 | Ht 70.0 in | Wt 200.0 lb

## 2022-01-26 DIAGNOSIS — E210 Primary hyperparathyroidism: Secondary | ICD-10-CM | POA: Diagnosis not present

## 2022-01-26 NOTE — Patient Instructions (Signed)
-   Stay Hydrated  - Avoid Over the counter calcium tablets  - Consume 2-3 servings of dietary calcium a day ( low fat dairy, green leafy vegetables etc)    

## 2022-01-26 NOTE — Progress Notes (Signed)
? ? ?Name: Matthew Walsh  ?MRN/ DOB: [id number], [birth year]    ?Age/ Sex: 71 y.o., male   ? ?PCP: [name], [place]   ?Reason for Endocrinology Evaluation: Hyperparathyroidism   ?   ?Date of Initial Endocrinology Evaluation: 11/02/2021  ? ? ?HPI: ?Mr. Horice Carrero is a 71 y.o. male with a past medical history of Dyslipidemia and BPH. The patient presented for initial endocrinology clinic visit on 11/02/2021 for consultative assistance with his Hyperparathyroidism .  ? ?Mr. Ambrosius indicates that he was first diagnosed with hypercalcemia in 2018,with a max serum level of  11.1 mg/dL in 07/2019 ( uncorrected) and PTH level of 134 pg/mL. Since that time, he has experienced symptoms of  polydipsia, but no polyuria, or constipation  ? ?No history of  lithium, or HCTZ use ? ? ?He denies  history of kidney stones, kidney disease, liver disease, granulomatous disease. ?DXA normal 11/2021 ? ?24-hour urinary calcium excretion was 168 mg ? ? ?He has hx of  broken finger at age 42.  ? ?He denies family history of osteoporosis, parathyroid disease. ?Mother with  thyroid disease.  ? ? ? ?SUBJECTIVE:  ? ? ? ?Today (01/26/22):  Mr. Milliman is here for follow-up on hyperparathyroidism. ? ? ?Denies muscle cramps  ?Denies polydipsia  ?Denies constipation  ?No renal stones  ? ?He stays hydrated  ?Consumes 2 servings of calcium  ?Not on vitamin  ? ?HISTORY:  ?Past Medical History:  ?Past Medical History:  ?Diagnosis Date  ? Asthma   ? As a child  ? Diverticulitis   ? GERD (gastroesophageal reflux disease)   ? Hypertension   ? ?Past Surgical History:  ?Past Surgical History:  ?Procedure Laterality Date  ? HERNIA REPAIR    ? TONSILLECTOMY AND ADENOIDECTOMY    ?  ?Social History:  reports that he has quit smoking. He has never used smokeless tobacco. He reports current alcohol use. He reports that he does not use drugs. ?Family History: family history includes Alcohol abuse in his brother, father, and maternal grandmother;  Diabetes in his maternal grandmother and mother; Early death in his father; Hearing loss in his father, maternal grandfather, and maternal grandmother; Heart attack in his mother; Heart disease in his father, maternal grandfather, maternal grandmother, paternal grandfather, and paternal grandmother; Hyperlipidemia in his father; Hypertension in his father; Miscarriages / [place] in his mother. ? ? ?HOME MEDICATIONS: ?Allergies as of 01/26/2022   ?No Known Allergies ?  ? ?  ?Medication List  ?  ? ?  ? Accurate as of January 26, 2022 10:26 AM. If you have any questions, ask your nurse or doctor.  ?  ?  ? ?  ? ?prazosin 5 MG capsule ?Commonly known as: MINIPRESS ?Take 1 capsule (5 mg total) by mouth 2 (two) times daily. ?  ?rosuvastatin 20 MG tablet ?Commonly known as: CRESTOR ?Take 1 tablet (20 mg total) by mouth daily. ?  ?sertraline 100 MG tablet ?Commonly known as: ZOLOFT ?Take 1 tablet (100 mg total) by mouth daily. ?  ?tamsulosin 0.4 MG Caps capsule ?Commonly known as: FLOMAX ?Take 1 capsule (0.4 mg total) by mouth daily. ?  ?valACYclovir 1000 MG tablet ?Commonly known as: VALTREX ?SMARTSIG:2 Tablet(s) By Mouth 1 to 2 Times Daily ?  ? ?  ?  ? ? ?REVIEW OF SYSTEMS: ?A comprehensive ROS was conducted with the patient and is negative except as per HPI   ? ? ? ?OBJECTIVE:  ?VS: BP 140/76 (BP Location: Left Arm, Patient  Position: Sitting, Cuff Size: Small)   Pulse 60   Ht '5\' 10"'$  (1.778 m)   Wt 200 lb (90.7 kg)   SpO2 98%   BMI 28.70 kg/m?   ? ?Wt Readings from Last 3 Encounters:  ?01/26/22 200 lb (90.7 kg)  ?11/02/21 195 lb 9.6 oz (88.7 kg)  ?08/19/21 188 lb (85.3 kg)  ? ? ? ?EXAM: ?General: Pt appears well and is in NAD  ?Hydration: Well-hydrated with moist mucous membranes and good skin turgor  ?Neck: General: Supple without adenopathy. ?Thyroid: Thyroid size normal.  No goiter or nodules appreciated.   ?Lungs: Clear with good BS bilat with no rales, rhonchi, or wheezes  ?Heart: Auscultation: RRR.  ?Abdomen:  Normoactive bowel sounds, soft, nontender, without masses or organomegaly palpable  ?Extremities:  ?BL LE: No pretibial edema normal ROM and strength.  ?Mental Status: Judgment, insight: Intact ?Orientation: Oriented to time, place, and person ?Mood and affect: No depression, anxiety, or agitation  ? ? ? ?DATA REVIEWED: ? Latest Reference Range & Units 11/02/21 10:02  ?Sodium 135 - 145 mEq/L 139  ?Potassium 3.5 - 5.1 mEq/L 4.8  ?Chloride 96 - 112 mEq/L 106  ?CO2 19 - 32 mEq/L 29  ?Glucose 70 - 99 mg/dL 100 (H)  ?BUN 6 - 23 mg/dL 21  ?Creatinine 0.40 - 1.50 mg/dL 1.23  ?Calcium 8.4 - 10.5 mg/dL 10.9 (H)  ?Albumin 3.5 - 5.2 g/dL 4.8  ?GFR >60.00 mL/min 60.00 (L)  ?VITD 30.00 - 100.00 ng/mL 34.92  ? ? ?DXA 11/07/2021 ?Results: ?  Lumbar spine L1-L4 Femoral neck (FN) 33% distal radius  ?T-score 2.1 RFN: 0.1 ?LFN: 0.1 1.6  ? ?  ?Assessment: the BMD is normal according to the [facility] classification for osteoporosis (see below).  ?  ? ?ASSESSMENT/PLAN/RECOMMENDATIONS:  ? ?Primary Hyperparathyroidism: ? ?-Patient is asymptomatic ?- Bone density was normal 11/2021 ?-24-hour urine collection for calcium was normal at 168 mg ?- Pt does NOT meet surgical criteria for surgical intervention at this time  ?- Vitamin D is normal and serum calcium elevated but stable  ? ? ? ?Recommendations ?Stay hydrated ?Avoid over-the-counter calcium tablets ?Consume 2-3 servings of dietary calcium a day ? ? ? ? ?Follow-up in 6 months ? ?Signed electronically by: ?Abby Nena Jordan, MD ? ?[facility]  ?[facility] ?[address] ?[place], [place] [address] ?Phone: [phone] ?FAX: [phone] ? ? ?CC: ?[name], [place] ?[address] ?[place] [address] ?Phone: [phone] ?Fax: [phone] ? ? ?Return to Endocrinology clinic as below: ?Future Appointments  ?Date Time Provider [place]  ?08/22/2022  8:20 AM [name], FNP [facility]  ?01/18/2023  9:40 AM  [facility]  ?  ? ? ? ? ? ?

## 2022-02-01 ENCOUNTER — Telehealth: Payer: Self-pay | Admitting: *Deleted

## 2022-02-01 NOTE — Telephone Encounter (Signed)
Received fax from pharmacy for prior auth on Flomax due to duplicate therapy.  Do you want patient both on prazosin and Flomax? ?

## 2022-02-03 ENCOUNTER — Telehealth: Payer: Self-pay | Admitting: Family

## 2022-02-03 NOTE — Telephone Encounter (Signed)
I have called the pt and he stated that he has been taking both and he does well on both medications. Pt stated that he has been on both for years now.  ?

## 2022-02-03 NOTE — Telephone Encounter (Signed)
I found where I started the Flomax but there was an error and the Minipress was continued as well. I had intended to try the Flomax (Tamsulosin) as an alternative. This is my oversight and I apologize. Let's stop the Tamsulosin for now and just have him stay on the Prazosin 5 mg bid since he has been on this for  years. ? ?I know he saw Dr. Louis Meckel (urology) in  regarding the elevated PSA and was aware that he was on both medications.  It may be that he actually wants him on both medications and we will call to get this clarified.  ? ?Can we call Dr. Carlton Adam office and clarify if he wants him one one or both medications? We will let patient know.  ?

## 2022-02-07 NOTE — Telephone Encounter (Signed)
Mickel Baas has called the office and got her questions answered.  ?

## 2022-02-08 ENCOUNTER — Other Ambulatory Visit: Payer: Self-pay | Admitting: Family

## 2022-02-08 ENCOUNTER — Telehealth: Payer: Self-pay

## 2022-02-08 MED ORDER — VALSARTAN 80 MG PO TABS: 80.0000 mg | ORAL_TABLET | Freq: Every day | ORAL | 1 refills | Status: DC

## 2022-02-08 NOTE — Telephone Encounter (Signed)
Caller Name Mickel Baas ?Caller Phone Number [phone] ?Patient Name Matthew Walsh ?Patient DOB [birth year] ?Call Type Message Only Information Provided ?Reason for Call Request for General Office Information ?Initial Comment Caller states she is calling from [facility] and she would like to leave a message for ?Dr. Jodi Mourning. Caller states flomax is much better for BPH but bad for treating HTN. ?Caller thinks patient should take a different blood pressure medicine. ?Disp. Time Disposition Final User ?02/08/2022 7:59:11 AM General Information Provided Yes Candise Bowens ?Call Closed By: Candise Bowens ?Transaction Date/Time: 02/08/2022 7:56:13 AM (ET) ?

## 2022-02-11 ENCOUNTER — Encounter: Payer: Self-pay | Admitting: Family

## 2022-02-13 NOTE — Telephone Encounter (Signed)
Pt has called back and I have relayed the message. Pt will be seen tomorrow as a f/u with the medications.  ?

## 2022-02-14 ENCOUNTER — Ambulatory Visit (INDEPENDENT_AMBULATORY_CARE_PROVIDER_SITE_OTHER): Payer: Medicare Other | Admitting: Family

## 2022-02-14 ENCOUNTER — Encounter: Payer: Self-pay | Admitting: Family

## 2022-02-14 DIAGNOSIS — I10 Essential (primary) hypertension: Secondary | ICD-10-CM

## 2022-02-14 DIAGNOSIS — R972 Elevated prostate specific antigen [PSA]: Secondary | ICD-10-CM

## 2022-02-14 MED ORDER — PRAZOSIN HCL 5 MG PO CAPS: 5.0000 mg | ORAL_CAPSULE | Freq: Two times a day (BID) | ORAL | 2 refills | Status: DC

## 2022-02-14 NOTE — Progress Notes (Signed)
?Matthew Walsh is a 71 y.o. male with the following history as recorded in EpicCare:  ?Patient Active Problem List  ? Diagnosis Date Noted  ? Trigger little finger of right hand 08/10/2020  ? Trigger little finger of left hand 08/10/2020  ? Trigger finger, left middle finger 08/10/2020  ? Trigger finger, right middle finger 12/10/2019  ? Pre-diabetes 01/19/2018  ? Encounter for general adult medical examination with abnormal findings 01/16/2018  ? BPH (benign prostatic hyperplasia) 08/20/2017  ? Hyperlipidemia 08/20/2017  ? Diverticulitis 08/20/2017  ? GERD (gastroesophageal reflux disease) 08/20/2017  ? Hypertension 08/20/2017  ? Alcohol use 08/20/2017  ? PTSD (post-traumatic stress disorder) 08/20/2017  ?  ?Current Outpatient Medications  ?Medication Sig Dispense Refill  ? rosuvastatin (CRESTOR) 20 MG tablet Take 1 tablet (20 mg total) by mouth daily. 90 tablet 3  ? sertraline (ZOLOFT) 100 MG tablet Take 1 tablet (100 mg total) by mouth daily. 90 tablet 3  ? tamsulosin (FLOMAX) 0.4 MG CAPS capsule Take 1 capsule (0.4 mg total) by mouth daily. 90 capsule 3  ? valACYclovir (VALTREX) 1000 MG tablet SMARTSIG:2 Tablet(s) By Mouth 1 to 2 Times Daily 60 tablet 1  ? prazosin (MINIPRESS) 5 MG capsule Take 1 capsule (5 mg total) by mouth 2 (two) times daily. 180 capsule 2  ? ?No current facility-administered medications for this visit.  ?  ?Allergies: Patient has no known allergies.  ?Past Medical History:  ?Diagnosis Date  ? Asthma   ? As a child  ? Diverticulitis   ? GERD (gastroesophageal reflux disease)   ? Hypertension   ?  ?Past Surgical History:  ?Procedure Laterality Date  ? HERNIA REPAIR    ? TONSILLECTOMY AND ADENOIDECTOMY    ?  ?Family History  ?Problem Relation Age of Onset  ? Diabetes Mother   ? Heart attack Mother   ? Miscarriages / [place] Mother   ? Alcohol abuse Father   ? Early death Father   ? Hearing loss Father   ? Heart disease Father   ? Hyperlipidemia Father   ? Hypertension Father   ?  Alcohol abuse Brother   ? Alcohol abuse Maternal Grandmother   ? Diabetes Maternal Grandmother   ? Hearing loss Maternal Grandmother   ? Heart disease Maternal Grandmother   ? Hearing loss Maternal Grandfather   ? Heart disease Maternal Grandfather   ? Heart disease Paternal Grandmother   ? Heart disease Paternal Grandfather   ?  ?Social History  ? ?Tobacco Use  ? Smoking status: Former  ? Smokeless tobacco: Never  ?Substance Use Topics  ? Alcohol use: Yes  ?  Comment: daily  ?  ?Subjective:  ? ?Medication review; patient has been on combination of Prazosin and Tamsulosin for "a long time." Prazosin is being used for nightmares from PTSD; Notes he is doing very well on combination of medication and does not want to change;  ?Did see urology in April who was comfortable with combination of medications; has close urology follow up scheduled for October 2023; ?Patient denies any concerns for orthostatic changes;  ? ? ? ?Objective:  ?Vitals:  ? 02/14/22 0839 02/14/22 0853  ?BP: (!) 158/78 128/74  ?Pulse: 65   ?Temp: 97.8 ?F (36.6 ?C)   ?TempSrc: Oral   ?SpO2: 96%   ?Weight: 197 lb 3.2 oz (89.4 kg)   ?Height: '5\' 10"'$  (1.778 m)   ?  ?General: Well developed, well nourished, in no acute distress  ?Skin : Warm and dry.  ?Head:  Normocephalic and atraumatic  ?Lungs: Respirations unlabored;  ?Neurologic: Alert and oriented; speech intact; face symmetrical; moves all extremities well; CNII-XII intact without focal deficit  ? ?Assessment:  ?1. Elevated PSA   ?2. Essential hypertension   ?  ?Plan:  ? ?Will plan to keep medications as prescribed for now; patient will be having further urology follow up in October and based on prostate evaluation, may have to discuss changing the Tamsulosin; will keep the Prazosin since patient is using to help manage PTSD, HTN and BPH symptoms.  ? ?No follow-ups on file.  ?No orders of the defined types were placed in this encounter. ?  ?Requested Prescriptions  ? ?Signed Prescriptions Disp Refills   ? prazosin (MINIPRESS) 5 MG capsule 180 capsule 2  ?  Sig: Take 1 capsule (5 mg total) by mouth 2 (two) times daily.  ?  ? ?

## 2022-04-27 ENCOUNTER — Telehealth: Payer: Self-pay | Admitting: Family

## 2022-04-27 ENCOUNTER — Telehealth: Payer: Self-pay

## 2022-04-27 NOTE — Telephone Encounter (Signed)
DONE

## 2022-04-27 NOTE — Telephone Encounter (Signed)
Pt stated he is in [place] for the summer, and it is very hard for him to get flomax. He would like to know if he can stop taking it for the moment and start up again the next time he sees her.

## 2022-04-27 NOTE — Telephone Encounter (Signed)
Pharmacy is calling for the patient stating the patient is currently in [place] for vacation and while trying to refill his Tamsulosin, the insurance stated he now needs a PA for it. Please advise.

## 2022-04-28 NOTE — Telephone Encounter (Signed)
Sent him a mychart message.

## 2022-07-24 DIAGNOSIS — R972 Elevated prostate specific antigen [PSA]: Secondary | ICD-10-CM | POA: Diagnosis not present

## 2022-07-31 DIAGNOSIS — R972 Elevated prostate specific antigen [PSA]: Secondary | ICD-10-CM | POA: Diagnosis not present

## 2022-07-31 DIAGNOSIS — F5221 Male erectile disorder: Secondary | ICD-10-CM | POA: Diagnosis not present

## 2022-08-02 ENCOUNTER — Encounter: Payer: Self-pay | Admitting: Internal Medicine

## 2022-08-02 ENCOUNTER — Ambulatory Visit (INDEPENDENT_AMBULATORY_CARE_PROVIDER_SITE_OTHER): Payer: Medicare Other | Admitting: Internal Medicine

## 2022-08-02 VITALS — BP 124/82 | HR 66 | Ht 70.0 in | Wt 190.0 lb

## 2022-08-02 DIAGNOSIS — E210 Primary hyperparathyroidism: Secondary | ICD-10-CM | POA: Diagnosis not present

## 2022-08-02 LAB — VITAMIN D 25 HYDROXY (VIT D DEFICIENCY, FRACTURES): VITD: 34.36 ng/mL (ref 30.00–100.00)

## 2022-08-02 LAB — BASIC METABOLIC PANEL
BUN: 19 mg/dL (ref 6–23)
CO2: 27 mEq/L (ref 19–32)
Calcium: 11 mg/dL — ABNORMAL HIGH (ref 8.4–10.5)
Chloride: 106 mEq/L (ref 96–112)
Creatinine, Ser: 1.22 mg/dL (ref 0.40–1.50)
GFR: 60.27 mL/min (ref >60.00)
Glucose, Bld: 102 mg/dL — ABNORMAL HIGH (ref 70–99)
Potassium: 4.9 mEq/L (ref 3.5–5.1)
Sodium: 138 mEq/L (ref 135–145)

## 2022-08-02 LAB — ALBUMIN: Albumin: 4.9 g/dL (ref 3.5–5.2)

## 2022-08-02 NOTE — Progress Notes (Signed)
Name: Matthew Walsh  MRN/ DOB: [id number], March 27, 1951    Age/ Sex: 71 y.o., male    PCP: [name], FNP   Reason for Endocrinology Evaluation: Hyperparathyroidism      Date of Initial Endocrinology Evaluation: 11/02/2021    HPI: [name] is a 71 y.o. male with a past medical history of Dyslipidemia and BPH. The patient presented for initial endocrinology clinic visit on 11/02/2021 for consultative assistance with his Hyperparathyroidism .   [name] indicates that he was first diagnosed with hypercalcemia in 2018,with a max serum level of  11.1 mg/dL in 07/2019 ( uncorrected) and PTH level of 134 pg/mL. Since that time, he has experienced symptoms of  polydipsia, but no polyuria, or constipation   No history of  lithium, or HCTZ use   He denies  history of kidney stones, kidney disease, liver disease, granulomatous disease. DXA normal 11/2021  24-hour urinary calcium excretion was 168 mg 11/2021   He has hx of  broken finger at age 44.   He denies family history of osteoporosis, parathyroid disease. Mother with  thyroid disease.    SUBJECTIVE:     Today (08/02/22):  [name] is here for follow-up on hyperparathyroidism.   Denies muscle cramps or pain  Denies polydipsia  Denies constipation  No recent diagnosis of renal stones   He stays hydrated  Consumes 2 servings of calcium  Not on calcium     HISTORY:  Past Medical History:  Past Medical History:  Diagnosis Date   Asthma    As a child   Diverticulitis    GERD (gastroesophageal reflux disease)    Hypertension    Past Surgical History:  Past Surgical History:  Procedure Laterality Date   HERNIA REPAIR     TONSILLECTOMY AND ADENOIDECTOMY      Social History:  reports that he has quit smoking. He has never used smokeless tobacco. He reports current alcohol use. He reports that he does not use drugs. Family History: family history includes Alcohol abuse in his  brother, father, and maternal grandmother; Diabetes in his maternal grandmother and mother; Early death in his father; Hearing loss in his father, maternal grandfather, and maternal grandmother; Heart attack in his mother; Heart disease in his father, maternal grandfather, maternal grandmother, paternal grandfather, and paternal grandmother; Hyperlipidemia in his father; Hypertension in his father; Miscarriages / [place] in his mother.   HOME MEDICATIONS: Allergies as of 08/02/2022   No Known Allergies      Medication List        Accurate as of August 02, 2022  8:56 AM. If you have any questions, ask your nurse or doctor.          prazosin 5 MG capsule Commonly known as: MINIPRESS Take 1 capsule (5 mg total) by mouth 2 (two) times daily.   rosuvastatin 20 MG tablet Commonly known as: CRESTOR Take 1 tablet (20 mg total) by mouth daily.   sertraline 100 MG tablet Commonly known as: ZOLOFT Take 1 tablet (100 mg total) by mouth daily.   tamsulosin 0.4 MG Caps capsule Commonly known as: FLOMAX Take 1 capsule (0.4 mg total) by mouth daily.   valACYclovir 1000 MG tablet Commonly known as: VALTREX SMARTSIG:2 Tablet(s) By Mouth 1 to 2 Times Daily          REVIEW OF SYSTEMS: A comprehensive ROS was conducted with the patient and is negative except as per HPI      OBJECTIVE:  VS: BP 124/82 (BP Location: Left Arm, Patient Position: Sitting, Cuff Size: Large)   Pulse 66   Ht '5\' 10"'$  (1.778 m)   Wt 190 lb (86.2 kg)   SpO2 96%   BMI 27.26 kg/m    Wt Readings from Last 3 Encounters:  08/02/22 190 lb (86.2 kg)  02/14/22 197 lb 3.2 oz (89.4 kg)  01/26/22 200 lb (90.7 kg)     EXAM: General: Pt appears well and is in NAD  Hydration: Well-hydrated with moist mucous membranes and good skin turgor  Neck: General: Supple without adenopathy. Thyroid: Thyroid size normal.  No goiter or nodules appreciated.   Lungs: Clear with good BS bilat with no rales, rhonchi, or  wheezes  Heart: Auscultation: RRR.  Abdomen: Normoactive bowel sounds, soft, nontender, without masses or organomegaly palpable  Extremities:  BL LE: No pretibial edema normal ROM and strength.  Mental Status: Judgment, insight: Intact Orientation: Oriented to time, place, and person Mood and affect: No depression, anxiety, or agitation     DATA REVIEWED:   Latest Reference Range & Units 08/02/22 09:09  Sodium 135 - 145 mEq/L 138  Potassium 3.5 - 5.1 mEq/L 4.9  Chloride 96 - 112 mEq/L 106  CO2 19 - 32 mEq/L 27  Glucose 70 - 99 mg/dL 102 (H)  BUN 6 - 23 mg/dL 19  Creatinine 0.40 - 1.50 mg/dL 1.22  Calcium 8.4 - 10.5 mg/dL 11.0 (H)  Albumin 3.5 - 5.2 g/dL 4.9  GFR >60.00 mL/min 60.27  VITD 30.00 - 100.00 ng/mL 34.36      DXA 11/07/2021 Results:   Lumbar spine L1-L4 Femoral neck (FN) 33% distal radius  T-score 2.1 RFN: 0.1 LFN: 0.1 1.6     Assessment: the BMD is normal according to the [facility] classification for osteoporosis (see below).     ASSESSMENT/PLAN/RECOMMENDATIONS:   Primary Hyperparathyroidism:  -Patient is asymptomatic - Bone density was normal 11/2021 -24-hour urine collection for calcium was normal at 168 mg - Pt does NOT meet surgical criteria for surgical intervention at this time  - Vitamin D is normal, corrected serum calcium is normal at 10.28 mg/DL     Recommendations Stay hydrated Avoid over-the-counter calcium tablets Consume 2-3 servings of dietary calcium a day     Follow-up in 6 months  Signed electronically by: Mack Guise, MD  [facility]  [facility] [address] [place], [place] [address] Phone: [phone] FAX: [phone]   CC: [name], [address] [place] [address] [place] [address] Phone: [phone] Fax: [phone]   Return to Endocrinology clinic as below: Future Appointments  Date Time Provider [place]  08/02/2022  9:10 AM  Tifany Hirsch, Melanie Crazier, MD [facility] None  08/22/2022  8:20 AM [name], FNP [facility] [place]  01/18/2023  9:40 AM [facility]

## 2022-08-02 NOTE — Patient Instructions (Signed)
-   Stay Hydrated  - Avoid Over the counter calcium tablets  - Consume 2-3 servings of dietary calcium a day ( low fat dairy, green leafy vegetables etc)

## 2022-08-03 LAB — PARATHYROID HORMONE, INTACT (NO CA): PTH: 64 pg/mL (ref 16–77)

## 2022-08-22 ENCOUNTER — Ambulatory Visit (INDEPENDENT_AMBULATORY_CARE_PROVIDER_SITE_OTHER): Payer: Medicare Other | Admitting: Family

## 2022-08-22 VITALS — BP 122/76 | HR 64 | Temp 98.0°F | Resp 18 | Ht 70.0 in | Wt 191.4 lb

## 2022-08-22 DIAGNOSIS — Z0000 Encounter for general adult medical examination without abnormal findings: Secondary | ICD-10-CM | POA: Diagnosis not present

## 2022-08-22 DIAGNOSIS — E785 Hyperlipidemia, unspecified: Secondary | ICD-10-CM

## 2022-08-22 LAB — LIPID PANEL
Cholesterol: 140 mg/dL (ref 0–200)
HDL: 39 mg/dL — ABNORMAL LOW (ref >39.00)
LDL Cholesterol: 80 mg/dL (ref 0–99)
NonHDL: 100.53
Total CHOL/HDL Ratio: 4
Triglycerides: 105 mg/dL (ref 0.0–149.0)
VLDL: 21 mg/dL (ref 0.0–40.0)

## 2022-08-22 LAB — COMPREHENSIVE METABOLIC PANEL
ALT: 39 U/L (ref 0–53)
AST: 24 U/L (ref 0–37)
Albumin: 4.7 g/dL (ref 3.5–5.2)
Alkaline Phosphatase: 63 U/L (ref 39–117)
BUN: 20 mg/dL (ref 6–23)
CO2: 28 mEq/L (ref 19–32)
Calcium: 10.6 mg/dL — ABNORMAL HIGH (ref 8.4–10.5)
Chloride: 104 mEq/L (ref 96–112)
Creatinine, Ser: 1.28 mg/dL (ref 0.40–1.50)
GFR: 56.87 mL/min — ABNORMAL LOW (ref >60.00)
Glucose, Bld: 99 mg/dL (ref 70–99)
Potassium: 4.7 mEq/L (ref 3.5–5.1)
Sodium: 137 mEq/L (ref 135–145)
Total Bilirubin: 1.2 mg/dL (ref 0.2–1.2)
Total Protein: 7.2 g/dL (ref 6.0–8.3)

## 2022-08-22 LAB — CBC WITH DIFFERENTIAL/PLATELET
Basophils Absolute: 0 10*3/uL (ref 0.0–0.1)
Basophils Relative: 0.1 % (ref 0.0–3.0)
Eosinophils Absolute: 0.1 10*3/uL (ref 0.0–0.7)
Eosinophils Relative: 1.4 % (ref 0.0–5.0)
HCT: 44.8 % (ref 39.0–52.0)
Hemoglobin: 14.8 g/dL (ref 13.0–17.0)
Lymphocytes Relative: 25.8 % (ref 12.0–46.0)
Lymphs Abs: 1.6 10*3/uL (ref 0.7–4.0)
MCHC: 33 g/dL (ref 30.0–36.0)
MCV: 91.5 fl (ref 78.0–100.0)
Monocytes Absolute: 0.4 10*3/uL (ref 0.1–1.0)
Monocytes Relative: 5.9 % (ref 3.0–12.0)
Neutro Abs: 4.3 10*3/uL (ref 1.4–7.7)
Neutrophils Relative %: 66.8 % (ref 43.0–77.0)
Platelets: 173 10*3/uL (ref 150.0–400.0)
RBC: 4.9 Mil/uL (ref 4.22–5.81)
RDW: 13.2 % (ref 11.5–15.5)
WBC: 6.4 10*3/uL (ref 4.0–10.5)

## 2022-08-22 NOTE — Progress Notes (Signed)
Matthew Walsh is a 71 y.o. male with the following history as recorded in EpicCare:  Patient Active Problem List   Diagnosis Date Noted   Trigger little finger of right hand 08/10/2020   Trigger little finger of left hand 08/10/2020   Trigger finger, left middle finger 08/10/2020   Trigger finger, right middle finger 12/10/2019   Pre-diabetes 01/19/2018   Encounter for general adult medical examination with abnormal findings 01/16/2018   BPH (benign prostatic hyperplasia) 08/20/2017   Hyperlipidemia 08/20/2017   Diverticulitis 08/20/2017   GERD (gastroesophageal reflux disease) 08/20/2017   Hypertension 08/20/2017   Alcohol use 08/20/2017   PTSD (post-traumatic stress disorder) 08/20/2017    Current Outpatient Medications  Medication Sig Dispense Refill   prazosin (MINIPRESS) 5 MG capsule Take 1 capsule (5 mg total) by mouth 2 (two) times daily. 180 capsule 2   rosuvastatin (CRESTOR) 20 MG tablet Take 1 tablet (20 mg total) by mouth daily. 90 tablet 3   sertraline (ZOLOFT) 100 MG tablet Take 1 tablet (100 mg total) by mouth daily. 90 tablet 3   valACYclovir (VALTREX) 1000 MG tablet SMARTSIG:2 Tablet(s) By Mouth 1 to 2 Times Daily 60 tablet 1   No current facility-administered medications for this visit.    Allergies: Patient has no known allergies.  Past Medical History:  Diagnosis Date   Asthma    As a child   Diverticulitis    GERD (gastroesophageal reflux disease)    Hypertension     Past Surgical History:  Procedure Laterality Date   HERNIA REPAIR     TONSILLECTOMY AND ADENOIDECTOMY      Family History  Problem Relation Age of Onset   Diabetes Mother    Heart attack Mother    Miscarriages / [place] Mother    Alcohol abuse Father    Early death Father    Hearing loss Father    Heart disease Father    Hyperlipidemia Father    Hypertension Father    Alcohol abuse Brother    Alcohol abuse Maternal Grandmother    Diabetes Maternal Grandmother    Hearing  loss Maternal Grandmother    Heart disease Maternal Grandmother    Hearing loss Maternal Grandfather    Heart disease Maternal Grandfather    Heart disease Paternal Grandmother    Heart disease Paternal Grandfather     Social History   Tobacco Use   Smoking status: Former   Smokeless tobacco: Never  Substance Use Topics   Alcohol use: Yes    Comment: daily    Subjective:   Patient presents for yearly CPE; continuing to work with urology and endocrinology;  Dr. Kelton Pillar- endocrine; will be having prostate MRI with urology;   Review of Systems  Constitutional: Negative.   HENT: Negative.    Eyes: Negative.   Respiratory: Negative.    Cardiovascular: Negative.   Gastrointestinal: Negative.   Genitourinary: Negative.   Musculoskeletal: Negative.   Skin: Negative.   Neurological: Negative.   Endo/Heme/Allergies: Negative.   Psychiatric/Behavioral: Negative.         Objective:  Vitals:   08/22/22 0826  BP: 122/76  Pulse: 64  Resp: 18  Temp: 98 F (36.7 C)  TempSrc: Oral  SpO2: 99%  Weight: 191 lb 6.4 oz (86.8 kg)  Height: _0  (1.778 m)    General: Well developed, well nourished, in no acute distress  Skin : Warm and dry.  Head: Normocephalic and atraumatic  Eyes: Sclera and conjunctiva clear; pupils round and reactive to  light; extraocular movements intact  Ears: External normal; canals clear; tympanic membranes normal  Oropharynx: Pink, supple. No suspicious lesions  Neck: Supple without thyromegaly, adenopathy  Lungs: Respirations unlabored; clear to auscultation bilaterally without wheeze, rales, rhonchi  CVS exam: normal rate and regular rhythm.  Abdomen: Soft; nontender; nondistended; normoactive bowel sounds; no masses or hepatosplenomegaly  Musculoskeletal: No deformities; no active joint inflammation  Extremities: No edema, cyanosis, clubbing  Vessels: Symmetric bilaterally  Neurologic: Alert and oriented; speech intact; face symmetrical; moves  all extremities well; CNII-XII intact without focal deficit   Assessment:  1. PE (physical exam), annual   2. Hyperlipidemia, unspecified hyperlipidemia type     Plan:  Age appropriate preventive healthcare needs addressed; encouraged regular eye doctor and dental exams; encouraged regular exercise; congratulated patient on commitment to health; will update labs and refills as needed today; follow-up in 1 year, sooner prn.    No follow-ups on file.  Orders Placed This Encounter  Procedures   CBC with Differential/Platelet   Comp Met (CMET)   Lipid panel    Requested Prescriptions    No prescriptions requested or ordered in this encounter

## 2022-09-02 ENCOUNTER — Encounter: Payer: Self-pay | Admitting: Family

## 2022-09-05 ENCOUNTER — Other Ambulatory Visit: Payer: Self-pay | Admitting: Family

## 2022-09-05 DIAGNOSIS — F4323 Adjustment disorder with mixed anxiety and depressed mood: Secondary | ICD-10-CM

## 2022-09-18 ENCOUNTER — Other Ambulatory Visit: Payer: Self-pay | Admitting: Family

## 2022-09-18 DIAGNOSIS — R972 Elevated prostate specific antigen [PSA]: Secondary | ICD-10-CM

## 2022-09-18 DIAGNOSIS — I10 Essential (primary) hypertension: Secondary | ICD-10-CM

## 2022-09-20 ENCOUNTER — Other Ambulatory Visit: Payer: Self-pay | Admitting: Family

## 2022-09-20 DIAGNOSIS — F4310 Post-traumatic stress disorder, unspecified: Secondary | ICD-10-CM

## 2022-09-22 DIAGNOSIS — Z23 Encounter for immunization: Secondary | ICD-10-CM | POA: Diagnosis not present

## 2022-10-17 ENCOUNTER — Ambulatory Visit (INDEPENDENT_AMBULATORY_CARE_PROVIDER_SITE_OTHER): Payer: Medicare Other | Admitting: Psychology

## 2022-10-17 DIAGNOSIS — F4310 Post-traumatic stress disorder, unspecified: Secondary | ICD-10-CM | POA: Diagnosis not present
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
## 2022-11-03 ENCOUNTER — Ambulatory Visit (INDEPENDENT_AMBULATORY_CARE_PROVIDER_SITE_OTHER): Payer: Medicare Other | Admitting: Psychology

## 2022-11-03 DIAGNOSIS — Z7189 Other specified counseling: Secondary | ICD-10-CM | POA: Diagnosis not present

## 2022-11-03 DIAGNOSIS — Z630 Problems in relationship with spouse or partner: Secondary | ICD-10-CM | POA: Diagnosis not present

## 2022-11-03 DIAGNOSIS — F4310 Post-traumatic stress disorder, unspecified: Secondary | ICD-10-CM

## 2022-11-03 DIAGNOSIS — F4323 Adjustment disorder with mixed anxiety and depressed mood: Secondary | ICD-10-CM

## 2022-11-03 NOTE — Progress Notes (Signed)
[facility] Counselor/Therapist Progress Note  Patient ID: Azael Ragain, MRN: [id number]   Date: 11/03/22  Time Spent: 2:35  pm - 3:34 pm : 59 Minutes  Treatment Type: Individual Therapy.  Reported Symptoms: Interpersonal stressors & marital stressors.   Mental Status Exam: Appearance:  Casual     Behavior: Appropriate  Motor: Normal  Speech/Language:  Clear and Coherent  Affect: Congruent  Mood: normal  Thought process: normal  Thought content:   WNL  Sensory/Perceptual disturbances:   WNL  Orientation: oriented to person, place, time/date, and situation  Attention: Good  Concentration: Good  Memory: WNL  Fund of knowledge:  Good  Insight:   Good  Judgment:  Good  Impulse Control: Good   Risk Assessment: Danger to Self:  No Self-injurious Behavior: No Danger to Others: No Duty to Warn:no Physical Aggression / Violence:No  Access to Firearms a concern: No  Gang Involvement:No   Subjective:   [name] participated from home, via video and consented to treatment. Therapist participated from home office. We met online due to [place] pandemic. [name] reviewed the events of the past week. We reviewed numerous treatment approaches including CBT, BA, Problem Solving, and Solution focused therapy. Psych-education regarding the Perrin's diagnosis of Counseling for marital and partner problems  Adjustment disorder with mixed anxiety and depressed mood was provided during the session. We discussed Tayler Saenz's goals treatment goals which include addressing interpersonal stressors, . [name] and [name] provided verbal approval of the treatment plan. Therapist will meet with [name] and [name] separately during the next two sessions and follow-ups were scheduled. We further delineates varius clinical issues including differing boundaries, communication styles, parenting styles, and parental pressures. Additional goals include empathetic  communication, conflict resolution, and assertive communication.   Interventions: Psycho-education & Goal Setting.   Diagnosis:  Counseling for marital and partner problems  Adjustment disorder with mixed anxiety and depressed mood  Psychiatric Treatment: Yes , PCP - [name] - NP. See chart for details.    Treatment Plan:  Client Abilities/Strengths Shneur is intelligent, forthcoming, and motivated for change.   Support System: Family and Friends.   Client Treatment Preferences Outpatient Therapy.   Client Statement of Needs Yony would like to have positive relationship with his son and daughter in-law, communicate concerns positively and resolve conflict, employ empathy  Treatment Level Weekly  Symptoms  Interpersonal stressors, marital stressors, improve conflict resolution, set boundaries with son.  (Status: maintained)   Goals:   Gwyndolyn Saxon experiences symptoms of interpersonal stressors    Target Date: 11/04/23 Frequency: Weekly  Progress: 0 Modality: individual    Therapist will provide referrals for additional resources as appropriate.  Therapist will provide psycho-education regarding Izan's diagnosis and corresponding treatment approaches and interventions. Licensed Clinical Social Worker, [place] [facility], LCSW will support the patient's ability to achieve the goals identified. will employ CBT, BA, Problem-solving, Solution Focused, Mindfulness,  coping skills, & other evidenced-based practices will be used to promote progress towards healthy functioning to help manage decrease symptoms associated with her diagnosis.   Reduce overall level, frequency, and intensity of the feelings of depression, anxiety and panic evidenced by decreased overall symptoms from 6 to 7 days/week to 0 to 1 days/week per client report for at least 3 consecutive months. Verbally express understanding of the relationship between feelings of depression, anxiety and their impact on  thinking patterns and behaviors. Verbalize an understanding of the role that distorted thinking plays in creating fears, excessive worry, and ruminations.    (  Gwyndolyn Saxon participated in the creation of the treatment plan)    Buena Irish, LCSW

## 2022-11-07 ENCOUNTER — Ambulatory Visit (INDEPENDENT_AMBULATORY_CARE_PROVIDER_SITE_OTHER): Payer: Medicare Other | Admitting: Physician Assistant

## 2022-11-07 DIAGNOSIS — M65322 Trigger finger, left index finger: Secondary | ICD-10-CM | POA: Diagnosis not present

## 2022-11-07 DIAGNOSIS — M65331 Trigger finger, right middle finger: Secondary | ICD-10-CM

## 2022-11-07 MED ORDER — METHYLPREDNISOLONE ACETATE 40 MG/ML IJ SUSP
13.3300 mg | INTRAMUSCULAR | Status: AC | PRN
  Administered 2022-11-07: 13.33 mg

## 2022-11-07 MED ORDER — BUPIVACAINE HCL 0.25 % IJ SOLN
0.3300 mL | INTRAMUSCULAR | Status: AC | PRN
  Administered 2022-11-07: .33 mL

## 2022-11-07 MED ORDER — LIDOCAINE HCL 1 % IJ SOLN
1.0000 mL | INTRAMUSCULAR | Status: AC | PRN
  Administered 2022-11-07: 1 mL

## 2022-11-07 NOTE — Progress Notes (Signed)
Office Visit Note   Patient: Matthew Walsh           Date of Birth: [birth year]           MRN: [id number] Visit Date: 11/07/2022              Requested by: [name], [address] [place],  [place] [address] PCP: [name], FNP   Assessment & Plan: Visit Diagnoses:  1. Trigger finger, right middle finger   2. Trigger index finger of left hand     Plan: Impression is recurrent right long and left index trigger fingers.  Today, we discussed various treatment options to include repeat cortisone injection and night splint for a week versus A1 pulley release.  He has elected to repeat cortisone injections today.  Follow-Up Instructions: Return if symptoms worsen or fail to improve.   Orders:  No orders of the defined types were placed in this encounter.  No orders of the defined types were placed in this encounter.     Procedures: Hand/UE Inj: R long A1 for trigger finger on 11/07/2022 10:13 AM Indications: pain Details: 25 G needle Medications: 1 mL lidocaine 1 %; 0.33 mL bupivacaine 0.25 %; 13.33 mg methylPREDNISolone acetate 40 MG/ML   Hand/UE Inj: L index A1 for trigger finger on 11/07/2022 10:14 AM Indications: pain Details: 25 G needle Medications: 1 mL lidocaine 1 %; 0.33 mL bupivacaine 0.25 %; 13.33 mg methylPREDNISolone acetate 40 MG/ML      Clinical Data: No additional findings.   Subjective: Chief Complaint  Patient presents with   Right Hand - Pain   Left Hand - Pain    HPI patient is a very pleasant 72 year old gentleman who comes in today with recurrent pain and triggering to the right long and left index fingers.  History of trigger fingers to both of the above-mentioned fingers.  Right long finger was injected on 03/19/2019 and again on 12/10/2019.  He has had fairly good relief following the injections.  Left index finger was injected on 08/23/2021.  The right hand is gotten to the point where he is  unable to fully flex the long finger secondary to pain and triggering.  He is not a diabetic.  Review of Systems as detailed in HPI.  All others reviewed and are negative.   Objective: Vital Signs: There were no vitals taken for this visit.  Physical Exam well-developed well-nourished gentleman in no acute distress.  Alert and oriented x 3.  Ortho Exam bilateral hand exam shows moderate pain in region with reducible triggering to the A1 pulley of the right long finger.  Left hand exam shows mild tenderness to the A1 pulley without reproducible triggering to the index finger.  He is neurovascular intact distally.  Specialty Comments:  No specialty comments available.  Imaging: No new imaging   PMFS History: Patient Active Problem List   Diagnosis Date Noted   Trigger little finger of right hand 08/10/2020   Trigger little finger of left hand 08/10/2020   Trigger finger, left middle finger 08/10/2020   Trigger finger, right middle finger 12/10/2019   Pre-diabetes 01/19/2018   Encounter for general adult medical examination with abnormal findings 01/16/2018   BPH (benign prostatic hyperplasia) 08/20/2017   Hyperlipidemia 08/20/2017   Diverticulitis 08/20/2017   GERD (gastroesophageal reflux disease) 08/20/2017   Hypertension 08/20/2017   Alcohol use 08/20/2017   PTSD (post-traumatic stress disorder) 08/20/2017   Past Medical History:  Diagnosis  Date   Asthma    As a child   Diverticulitis    GERD (gastroesophageal reflux disease)    Hypertension     Family History  Problem Relation Age of Onset   Diabetes Mother    Heart attack Mother    Miscarriages / [place] Mother    Alcohol abuse Father    Early death Father    Hearing loss Father    Heart disease Father    Hyperlipidemia Father    Hypertension Father    Alcohol abuse Brother    Alcohol abuse Maternal Grandmother    Diabetes Maternal Grandmother    Hearing loss Maternal Grandmother    Heart disease  Maternal Grandmother    Hearing loss Maternal Grandfather    Heart disease Maternal Grandfather    Heart disease Paternal Grandmother    Heart disease Paternal Grandfather     Past Surgical History:  Procedure Laterality Date   HERNIA REPAIR     TONSILLECTOMY AND ADENOIDECTOMY     Social History   Occupational History   Not on file  Tobacco Use   Smoking status: Former   Smokeless tobacco: Never  Substance and Sexual Activity   Alcohol use: Yes    Comment: daily   Drug use: No   Sexual activity: Yes    Partners: Female

## 2022-11-13 ENCOUNTER — Ambulatory Visit (INDEPENDENT_AMBULATORY_CARE_PROVIDER_SITE_OTHER): Payer: Medicare Other | Admitting: Psychology

## 2022-11-13 DIAGNOSIS — F4323 Adjustment disorder with mixed anxiety and depressed mood: Secondary | ICD-10-CM | POA: Diagnosis not present

## 2022-11-13 DIAGNOSIS — Z630 Problems in relationship with spouse or partner: Secondary | ICD-10-CM

## 2022-11-13 DIAGNOSIS — Z7189 Other specified counseling: Secondary | ICD-10-CM

## 2022-11-13 NOTE — Progress Notes (Signed)
[facility] Counselor/Therapist Progress Note  Patient ID: Matthew Walsh, MRN: [id number]   Date: 11/13/22  Time Spent: 12:35  pm - 1:29 pm : 54 Minutes  Treatment Type: Individual Therapy.  Reported Symptoms: Interpersonal stressors & marital stressors.   Mental Status Exam: Appearance:  Neat and Well Groomed     Behavior: Appropriate  Motor: Normal  Speech/Language:  Clear and Coherent  Affect: Congruent  Mood: dysthymic  Thought process: normal  Thought content:   WNL  Sensory/Perceptual disturbances:   WNL  Orientation: oriented to person, place, time/date, and situation  Attention: Good  Concentration: Good  Memory: WNL  Fund of knowledge:  Good  Insight:   Good  Judgment:  Good  Impulse Control: Good   Risk Assessment: Danger to Self:  No Self-injurious Behavior: No Danger to Others: No Duty to Warn:no Physical Aggression / Violence:No  Access to Firearms a concern: No  Gang Involvement:No   Subjective:   Matthew Walsh (Matthew Walsh's wife) participated from office with therapist and consented to treatment as a family psychotherapy session without patient present. Therapist participated from home office. We met online due to [place] pandemic. Matthew Walsh reviewed the events of the past week. Matthew Walsh noted the "central issue" is that they do not align and that "Matthew Walsh will do "anything" for Matthew Walsh". She noted that Matthew Walsh see's her as "the problem". She noted Matthew Walsh labeling Matthew Walsh as a "narcissist". She noted Thanksgiving being the "issue". She highlighted the varying differences in their parenting style from Matthew Walsh's childhood. She noted Matthew Walsh's upbringing including "being responsible to make his parent's happy". She noted Matthew Walsh being the "fixer" and was responsible for the happiness of his parents.  She noted the restrictions being placed on her that Matthew Walsh does not experience.  She provided additional background regarding family history and stressors. She noted she and Matthew Walsh "always rescued  him (Matthew Walsh)". Therapist encouraged Matthew Walsh to identify the commonalities that She and Matthew Walsh have in relation to Matthew Walsh. Therapist encouraged Matthew Walsh to identify the pos and cons of being "fixers" for Matthew Walsh and his wife. We will process this going forward. Matthew Walsh will contact office to schedule continued joint sessions. Therapist validated and normalized Matthew Walsh's feelings and experience. Therapist provided supportive therapy.   Interventions: CBT & interpersonal.   Diagnosis:  Adjustment disorder with mixed anxiety and depressed mood  Counseling for marital and partner problems  Psychiatric Treatment: Yes , PCP - [name] - NP. See chart for details.    Treatment Plan:  Client Abilities/Strengths Matthew Walsh is intelligent, forthcoming, and motivated for change.   Support System: Family and Friends.   Client Treatment Preferences Outpatient Therapy.   Client Statement of Needs Matthew Walsh would like to have positive relationship with his son and daughter in-law, communicate concerns positively and resolve conflict, employ empathy  Treatment Level Weekly  Symptoms  Interpersonal stressors, marital stressors, improve conflict resolution, set boundaries with son.  (Status: maintained)   Goals:   Matthew Walsh experiences symptoms of interpersonal stressors    Target Date: 11/04/23 Frequency: Weekly  Progress: 0 Modality: individual    Therapist will provide referrals for additional resources as appropriate.  Therapist will provide psycho-education regarding Jhan's diagnosis and corresponding treatment approaches and interventions. Licensed Clinical Social Worker, [place], LCSW will support the patient's ability to achieve the goals identified. will employ CBT, BA, Problem-solving, Solution Focused, Mindfulness,  coping skills, & other evidenced-based practices will be used to promote progress towards healthy functioning to help manage decrease symptoms associated with her diagnosis.  Reduce  overall level, frequency, and intensity of the feelings of depression, anxiety and panic evidenced by decreased overall symptoms from 6 to 7 days/week to 0 to 1 days/week per client report for at least 3 consecutive months. Verbally express understanding of the relationship between feelings of depression, anxiety and their impact on thinking patterns and behaviors. Verbalize an understanding of the role that distorted thinking plays in creating fears, excessive worry, and ruminations.    Matthew Walsh participated in the creation of the treatment plan)    Buena Irish, LCSW

## 2022-11-21 ENCOUNTER — Other Ambulatory Visit: Payer: Self-pay | Admitting: Urology

## 2022-11-21 DIAGNOSIS — R972 Elevated prostate specific antigen [PSA]: Secondary | ICD-10-CM

## 2022-11-27 ENCOUNTER — Ambulatory Visit: Payer: Medicare Other | Admitting: Psychology

## 2022-12-05 ENCOUNTER — Ambulatory Visit (INDEPENDENT_AMBULATORY_CARE_PROVIDER_SITE_OTHER): Payer: Medicare Other | Admitting: Psychology

## 2022-12-05 DIAGNOSIS — Z630 Problems in relationship with spouse or partner: Secondary | ICD-10-CM | POA: Diagnosis not present

## 2022-12-05 DIAGNOSIS — F4310 Post-traumatic stress disorder, unspecified: Secondary | ICD-10-CM

## 2022-12-05 DIAGNOSIS — F4323 Adjustment disorder with mixed anxiety and depressed mood: Secondary | ICD-10-CM

## 2022-12-05 DIAGNOSIS — Z7189 Other specified counseling: Secondary | ICD-10-CM

## 2022-12-05 NOTE — Progress Notes (Signed)
[facility] Counselor/Therapist Progress Note  Patient ID: Matthew Walsh, MRN: [id number]   Date: 12/05/22  Time Spent: 8:02  am - 9:01 am : 38 Minutes  Treatment Type: Individual Therapy.  Reported Symptoms: Interpersonal stressors & marital stressors.   Mental Status Exam: Appearance:  Neat and Well Groomed     Behavior: Appropriate  Motor: Normal  Speech/Language:  Clear and Coherent  Affect: Congruent  Mood: dysthymic  Thought process: normal  Thought content:   WNL  Sensory/Perceptual disturbances:   WNL  Orientation: oriented to person, place, time/date, and situation  Attention: Good  Concentration: Good  Memory: WNL  Fund of knowledge:  Good  Insight:   Good  Judgment:  Good  Impulse Control: Good   Risk Assessment: Danger to Self:  No Self-injurious Behavior: No Danger to Others: No Duty to Warn:no Physical Aggression / Violence:No  Access to Firearms a concern: No  Gang Involvement:No   Subjective:   Matthew Walsh participated from office with therapist and consented to treatment as a family psychotherapy session without patient present. Therapist participated from office. We met online due to [place] pandemic. Matthew Walsh reviewed the events of the past week. He noted some improvement in his relationship with his son. He noted some progress between Matthew Walsh and their son, Matthew Walsh. Matthew Walsh noted his perspective regarding past negative interactions and noted the lack of consistency in relation to predictable behaviors from his son and wife in relation to Matthew Walsh. He noted a need for change, from himself, in relation to providing more support for Matthew Walsh. He noted being a "total enabler" and noted the recognition that he needs to less so. Matthew Walsh noted feeling protective of his son. He noted his effort of keeping him (Matthew Walsh) out of difficult situations and noted being "over protective" throughout childhood. He noted being a "helicopter parent". He noted often being over  protective of Matthew Walsh and discussed that it would be  "Matthew Walsh" when sees his son in need and not help. Matthew Walsh noted working at [employer] and helping the family in "every way".  He noted feeling like an "asshole" if not being helpful when he can be. He provided a history of his childhood. Parents work, both alcoholic, had to provide for self despite both being gainfully employed. He noted having to leave private school to provide support for family due to their alcohol use. He noted going to 10 different school. Responsible for sibling who was older. He noted "I wanted to be more his friend that his dad and I was horrible at discipline". Therapist encouraged Matthew Walsh to identify what adjustments he would like to make in his parenting style and boundaries for self and Son. Additionally, therapist encouraged Matthew Walsh to identify possible changes from [place] in regards to his approach and demeanor. Matthew Walsh noted being hopeful at the current state of things and therapist highlighted the importance of identifying barriers to progress, responsibility for all parties, and ways to affect possible change long-term. 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  He noted often feeling a level of guilt when not helping his family and noted this feeling being present with his own son, when unable to help. He noted often having to protect Matthew Walsh from Matthew Walsh due to her having boundaries and being accountable. Therapist validated and normalized Matthew Walsh's feelings and experience. Therapist provided supportive therapy. Matthew Walsh will contact therapist to schedule continued treatment for him and Matthew Walsh.   Interventions: CBT & interpersonal.   Diagnosis:  Adjustment disorder with mixed  anxiety and depressed mood  Counseling for marital and partner problems  PTSD (post-traumatic stress disorder)  Psychiatric Treatment: Yes , PCP - Matthew Walsh - NP. See chart for details.    Treatment Plan:  Client Abilities/Strengths Matthew Walsh is intelligent,  forthcoming, and motivated for change.   Support System: Family and Friends.   Client Treatment Preferences Outpatient Therapy.   Client Statement of Needs Matthew Walsh would like to have positive relationship with his son and daughter in-law, communicate concerns positively and resolve conflict, employ empathy  Treatment Level Weekly  Symptoms  Interpersonal stressors, marital stressors, improve conflict resolution, set boundaries with son.  (Status: maintained)   Goals:   Matthew Walsh experiences symptoms of interpersonal stressors    Target Date: 11/04/23 Frequency: Weekly  Progress: 0 Modality: individual    Therapist will provide referrals for additional resources as appropriate.  Therapist will provide psycho-education regarding Matthew Walsh's diagnosis and corresponding treatment approaches and interventions. Licensed Clinical Social Worker, [place], LCSW will support the patient's ability to achieve the goals identified. will employ CBT, BA, Problem-solving, Solution Focused, Mindfulness,  coping skills, & other evidenced-based practices will be used to promote progress towards healthy functioning to help manage decrease symptoms associated with her diagnosis.   Reduce overall level, frequency, and intensity of the feelings of depression, anxiety and panic evidenced by decreased overall symptoms from 6 to 7 days/week to 0 to 1 days/week per client report for at least 3 consecutive months. Verbally express understanding of the relationship between feelings of depression, anxiety and their impact on thinking patterns and behaviors. Verbalize an understanding of the role that distorted thinking plays in creating fears, excessive worry, and ruminations.    Matthew Walsh participated in the creation of the treatment plan)    Buena Irish, LCSW

## 2022-12-17 ENCOUNTER — Ambulatory Visit
Admission: RE | Admit: 2022-12-17 | Discharge: 2022-12-17 | Disposition: A | Discharge status: Home / Self Care | Payer: Medicare Other | Source: Ambulatory Visit | Attending: Urology | Admitting: Urology

## 2022-12-17 DIAGNOSIS — R972 Elevated prostate specific antigen [PSA]: Secondary | ICD-10-CM

## 2022-12-17 MED ORDER — GADOPICLENOL 0.5 MMOL/ML IV SOLN
9.0000 mL | Freq: Once | INTRAVENOUS | Status: AC | PRN
  Administered 2022-12-17: 9 mL via INTRAVENOUS

## 2022-12-21 ENCOUNTER — Ambulatory Visit (INDEPENDENT_AMBULATORY_CARE_PROVIDER_SITE_OTHER): Payer: Medicare Other | Admitting: Psychology

## 2022-12-21 DIAGNOSIS — Z630 Problems in relationship with spouse or partner: Secondary | ICD-10-CM | POA: Diagnosis not present

## 2022-12-21 DIAGNOSIS — F4323 Adjustment disorder with mixed anxiety and depressed mood: Secondary | ICD-10-CM | POA: Diagnosis not present

## 2022-12-21 DIAGNOSIS — F4310 Post-traumatic stress disorder, unspecified: Secondary | ICD-10-CM

## 2022-12-21 DIAGNOSIS — Z7189 Other specified counseling: Secondary | ICD-10-CM

## 2022-12-21 NOTE — Progress Notes (Signed)
[facility] Counselor/Matthew Progress Note  Patient ID: Matthew Walsh, MRN: [id number]   Date: 12/21/22  Time Spent: 10:04 am - 11:01 am : 57 Minutes  Treatment Type: Individual Therapy.  Reported Symptoms: Interpersonal stressors & marital stressors.   Mental Status Exam: Appearance:  Casual     Behavior: Appropriate  Motor: Normal  Speech/Language:  Clear and Coherent  Affect: Congruent  Mood: normal  Thought process: normal  Thought content:   WNL  Sensory/Perceptual disturbances:   WNL  Orientation: oriented to person, place, time/date, and situation  Attention: Good  Concentration: Good  Memory: WNL  Fund of knowledge:  Good  Insight:   Good  Judgment:  Good  Impulse Control: Good   Risk Assessment: Danger to Self:  No Self-injurious Behavior: No Danger to Others: No Duty to Warn:no Physical Aggression / Violence:No  Access to Firearms a concern: No  Gang Involvement:No   Subjective:   Matthew Walsh participated from home, via video and consented to treatment. Matthew Walsh. We met online due to [place] pandemic. Matthew Walsh Matthew Walsh reviewed the events of the past week. Matthew Walsh noted her frustrations with Matthew Walsh's prioritization of their son's relationship. We explored their varying boundaries that don't align with one another and create stress within the relationship. Matthew Walsh identified an insight during the session. Matthew Walsh noted his lack of control in relation to his son's behavior. He noted a need to set more boundaries, become more of a united front with Matthew Walsh, and communicate with her more consistently. He noted that Matthew Walsh's boundaries are clear and reliable. Matthew Walsh noted that Matthew Walsh has had a difficulty setting boundaries with family members and this causing interpersonal stressors. We worked on Research officer, political party as a team, Gaffer, and ways to provide empathy and support. Matthew encouraged Matthew Walsh to identify boundaries going  forward, identify how he would like to communicate them, to be processing during our follow-up. Matthew Walsh and Matthew Walsh were engaged and motivated during the session. We reviewed implicit and explicit communication regarding boundaries and what this might affect relationship dynamics. Matthew challenged Matthew Walsh to identify the cost of the behavior and interactions with his son and daughter in-law. Matthew validated and normalized Matthew Walsh and Matthew Walsh's feelings and provided supportive therapy.   Interventions: Interpersonal & CBT.   Diagnosis:  Adjustment disorder with mixed anxiety and depressed mood  Counseling for marital and partner problems  PTSD (post-traumatic stress disorder)  Psychiatric Treatment: Yes , PCP - [name] - NP. See chart for details.    Treatment Plan:  Client Abilities/Strengths Matthew Walsh is intelligent, forthcoming, and motivated for change.   Support System: Family and Friends.   Client Treatment Preferences Outpatient Therapy.   Client Statement of Needs Matthew Walsh would like to have positive relationship with his son and daughter in-law, communicate concerns positively and resolve conflict, employ empathy  Treatment Level Weekly  Symptoms  Interpersonal stressors, marital stressors, improve conflict resolution, set boundaries with son.  (Status: maintained)   Goals:   Matthew Walsh experiences symptoms of interpersonal stressors    Target Date: 11/04/23 Frequency: Weekly  Progress: 0 Modality: individual    Matthew will provide referrals for additional resources as appropriate.  Matthew will provide psycho-education regarding [name]'s diagnosis and corresponding treatment approaches and interventions. Licensed Clinical Social Worker, [place], LCSW will support the patient's ability to achieve the goals identified. will employ CBT, BA, Problem-solving, Solution Focused, Mindfulness,  coping skills, & other evidenced-based practices will be used to promote  progress towards healthy  functioning to help manage decrease symptoms associated with her diagnosis.   Reduce overall level, frequency, and intensity of the feelings of depression, anxiety and panic evidenced by decreased overall symptoms from 6 to 7 days/week to 0 to 1 days/week per client report for at least 3 consecutive months. Verbally express understanding of the relationship between feelings of depression, anxiety and their impact on thinking patterns and behaviors. Verbalize an understanding of the role that distorted thinking plays in creating fears, excessive worry, and ruminations.    Matthew Walsh participated in the creation of the treatment plan)    Buena Irish, LCSW

## 2023-01-15 ENCOUNTER — Ambulatory Visit (INDEPENDENT_AMBULATORY_CARE_PROVIDER_SITE_OTHER): Payer: Medicare Other | Admitting: Psychology

## 2023-01-15 DIAGNOSIS — F4323 Adjustment disorder with mixed anxiety and depressed mood: Secondary | ICD-10-CM | POA: Diagnosis not present

## 2023-01-15 DIAGNOSIS — Z7189 Other specified counseling: Secondary | ICD-10-CM | POA: Diagnosis not present

## 2023-01-15 DIAGNOSIS — F4310 Post-traumatic stress disorder, unspecified: Secondary | ICD-10-CM | POA: Diagnosis not present

## 2023-01-15 DIAGNOSIS — Z630 Problems in relationship with spouse or partner: Secondary | ICD-10-CM | POA: Diagnosis not present

## 2023-01-15 NOTE — Progress Notes (Signed)
[facility] Counselor/Therapist Progress Note  Patient ID: Matthew Walsh, MRN: [id number]   Date: 01/15/23  Time Spent: 8:01 am - 8:56 am : 55 Minutes  Treatment Type: Individual Therapy.  Reported Symptoms: Interpersonal stressors & marital stressors.   Mental Status Exam: Appearance:  Casual     Behavior: Appropriate  Motor: Normal  Speech/Language:  Clear and Coherent  Affect: Congruent  Mood: normal  Thought process: normal  Thought content:   WNL  Sensory/Perceptual disturbances:   WNL  Orientation: oriented to person, place, time/date, and situation  Attention: Good  Concentration: Good  Memory: WNL  Fund of knowledge:  Good  Insight:   Good  Judgment:  Good  Impulse Control: Good   Risk Assessment: Danger to Self:  No Self-injurious Behavior: No Danger to Others: No Duty to Warn:no Physical Aggression / Violence:No  Access to Firearms a concern: No  Gang Involvement:No   Subjective:   Matthew Walsh participated from home, via video and consented to treatment. Therapist participated from home office. We met online due to COVID pandemic. Matthew Walsh reviewed the events of the past week. Matthew Walsh noted his work regarding setting boundaries with his son and DIL and noted recently sending a text and not receiving word back. He noted a rise in anxiety. Therapist provided psycho-education regarding jumps to conclusions. 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 He noted this being his role since childhood. He noted discomfort with unaddressed issues in regards to tasks and relationship stressors. Therapist introduced distress tolerance and radical acceptance and provided handout, via email, for reference and review. Therapist modeled this during the session and discussed the importance of managing distress tolerance. Matthew Walsh and Matthew Walsh were engaged and motivated during the session. Therapist encouraged Matthew Walsh to  work on his distress tolerance, in simple ways, and delay aciton on minior tasks at home and employ REST acronym. Therapist validated and normalized Matthew Walsh and Walsh's feelings and provided supportive therapy.   Interventions: Interpersonal & CBT.   Diagnosis:  Adjustment disorder with mixed anxiety and depressed mood  Counseling for marital and partner problems  PTSD (post-traumatic stress disorder)  Psychiatric Treatment: Yes , PCP - Burnell Blanks - NP. See chart for details.    Treatment Plan:  Client Abilities/Strengths Matthew Walsh is intelligent, forthcoming, and motivated for change.   Support System: Family and Friends.   Client Treatment Preferences Outpatient Therapy.   Client Statement of Needs Matthew Walsh would like to have positive relationship with his son and daughter in-law, communicate concerns positively and resolve conflict, employ empathy  Treatment Level Weekly  Symptoms  Interpersonal stressors, marital stressors, improve conflict resolution, set boundaries with son.  (Status: maintained)   Goals:   Matthew Noa experiences symptoms of interpersonal stressors    Target Date: 11/04/23 Frequency: Weekly  Progress: 0 Modality: individual    Therapist will provide referrals for additional resources as appropriate.  Therapist will provide psycho-education regarding Matthew Walsh's diagnosis and corresponding treatment approaches and interventions. Licensed Clinical Social Worker, [place], LCSW will support the patient's ability to achieve the goals identified. will employ CBT, BA, Problem-solving, Solution Focused, Mindfulness,  coping skills, & other evidenced-based practices will be used to promote progress towards healthy functioning to help manage decrease symptoms associated with her diagnosis.   Reduce overall level, frequency, and intensity of the feelings of depression, anxiety and panic evidenced by decreased overall symptoms from 6 to 7 days/week to 0 to 1  days/week per client  report for at least 3 consecutive months. Verbally express understanding of the relationship between feelings of depression, anxiety and their impact on thinking patterns and behaviors. Verbalize an understanding of the role that distorted thinking plays in creating fears, excessive worry, and ruminations.    Matthew Noa participated in the creation of the treatment plan)    Matthew Ovens, LCSW

## 2023-01-18 ENCOUNTER — Ambulatory Visit: Payer: Medicare Other

## 2023-02-01 ENCOUNTER — Encounter: Payer: Self-pay | Admitting: Internal Medicine

## 2023-02-01 ENCOUNTER — Ambulatory Visit (INDEPENDENT_AMBULATORY_CARE_PROVIDER_SITE_OTHER): Payer: Medicare Other | Admitting: Internal Medicine

## 2023-02-01 VITALS — BP 120/72 | HR 63 | Ht 70.0 in | Wt 194.0 lb

## 2023-02-01 DIAGNOSIS — E210 Primary hyperparathyroidism: Secondary | ICD-10-CM | POA: Diagnosis not present

## 2023-02-01 LAB — BASIC METABOLIC PANEL
BUN: 17 mg/dL (ref 6–23)
CO2: 25 mEq/L (ref 19–32)
Calcium: 10.8 mg/dL — ABNORMAL HIGH (ref 8.4–10.5)
Chloride: 105 mEq/L (ref 96–112)
Creatinine, Ser: 1.31 mg/dL (ref 0.40–1.50)
GFR: 55.14 mL/min — ABNORMAL LOW (ref >60.00)
Glucose, Bld: 96 mg/dL (ref 70–99)
Potassium: 4.8 mEq/L (ref 3.5–5.1)
Sodium: 138 mEq/L (ref 135–145)

## 2023-02-01 LAB — VITAMIN D 25 HYDROXY (VIT D DEFICIENCY, FRACTURES): VITD: 42.18 ng/mL (ref 30.00–100.00)

## 2023-02-01 LAB — ALBUMIN: Albumin: 4.7 g/dL (ref 3.5–5.2)

## 2023-02-01 NOTE — Patient Instructions (Signed)
-   Stay Hydrated  ?- Avoid Over the counter calcium tablets  ?- Consume 2-3 servings of dietary calcium a day ( low fat dairy, green leafy vegetables etc) ? ? ? ? ?

## 2023-02-01 NOTE — Progress Notes (Signed)
Name: Matthew Walsh  MRN/ DOB: [id number], [birth year]    Age/ Sex: 72 y.o., male    PCP: Olive Bass, FNP   Reason for Endocrinology Evaluation: Hyperparathyroidism      Date of Initial Endocrinology Evaluation: 11/02/2021    HPI: Matthew Walsh is a 72 y.o. male with a past medical history of Dyslipidemia and BPH. The patient presented for initial endocrinology clinic visit on 11/02/2021 for consultative assistance with his Hyperparathyroidism .   Matthew Walsh indicates that he was first diagnosed with hypercalcemia in 2018,with a max serum level of  11.1 mg/dL in 40/9811 ( uncorrected) and PTH level of 134 pg/mL. Since that time, he has experienced symptoms of  polydipsia, but no polyuria, or constipation   No history of  lithium, or HCTZ use   He denies  history of kidney stones, kidney disease, liver disease, granulomatous disease.  DXA normal 11/2021  24-hour urinary calcium excretion was 168 mg 11/2021   He has hx of  broken finger at age 30.   He denies family history of osteoporosis, parathyroid disease. Mother with  thyroid disease.    SUBJECTIVE:     Today (02/01/23):  Matthew Walsh is here for follow-up on hyperparathyroidism.  He stays hydrated  Denies local neck swelling  Denies constipation  Denies polydipsia  No recent diagnosis of renal stones  He fell while on vacation while backing from a wave   He stays hydrated  Consumes 2 servings of calcium daily       HISTORY:  Past Medical History:  Past Medical History:  Diagnosis Date  . Asthma    As a child  . Diverticulitis   . GERD (gastroesophageal reflux disease)   . Hypertension    Past Surgical History:  Past Surgical History:  Procedure Laterality Date  . HERNIA REPAIR    . TONSILLECTOMY AND ADENOIDECTOMY      Social History:  reports that he has quit smoking. He has never used smokeless tobacco. He reports current alcohol use. He reports that he does not use  drugs. Family History: family history includes Alcohol abuse in his brother, father, and maternal grandmother; Diabetes in his maternal grandmother and mother; Early death in his father; Hearing loss in his father, maternal grandfather, and maternal grandmother; Heart attack in his mother; Heart disease in his father, maternal grandfather, maternal grandmother, paternal grandfather, and paternal grandmother; Hyperlipidemia in his father; Hypertension in his father; Miscarriages / [place] in his mother.   HOME MEDICATIONS: Allergies as of 02/01/2023   No Known Allergies      Medication List        Accurate as of Feb 01, 2023  9:06 AM. If you have any questions, ask your nurse or doctor.          prazosin 5 MG capsule Commonly known as: MINIPRESS Take 1 capsule by mouth twice daily   rosuvastatin 20 MG tablet Commonly known as: CRESTOR Take 1 tablet by mouth once daily   sertraline 100 MG tablet Commonly known as: ZOLOFT Take 1 tablet by mouth once daily   valACYclovir 1000 MG tablet Commonly known as: VALTREX TAKE 2 TABLETS BY MOUTH 1 TO 2 TIMES DAILY          REVIEW OF SYSTEMS: A comprehensive ROS was conducted with the patient and is negative except as per HPI      OBJECTIVE:  VS: BP 120/72 (BP Location: Left Arm, Patient Position: Sitting, Cuff Size: Small)  Pulse 63   Ht 5\' 10"  (1.778 m)   Wt 194 lb (88 kg)   SpO2 95%   BMI 27.84 kg/m    Wt Readings from Last 3 Encounters:  02/01/23 194 lb (88 kg)  08/22/22 191 lb 6.4 oz (86.8 kg)  08/02/22 190 lb (86.2 kg)     EXAM: General: Pt appears well and is in NAD  Neck: General: Supple without adenopathy. Thyroid: Thyroid size normal.  No goiter or nodules appreciated.   Lungs: Clear with good BS bilat   Heart: Auscultation: RRR.  Abdomen: soft, nontender  Extremities:  BL LE: No pretibial edema   Mental Status: Judgment, insight: Intact Orientation: Oriented to time, place, and person Mood and  affect: No depression, anxiety, or agitation     DATA REVIEWED:  Latest Reference Range & Units 02/01/23 09:19  Sodium 135 - 145 mEq/L 138  Potassium 3.5 - 5.1 mEq/L 4.8  Chloride 96 - 112 mEq/L 105  CO2 19 - 32 mEq/L 25  Glucose 70 - 99 mg/dL 96  BUN 6 - 23 mg/dL 17  Creatinine 1.61 - 0.96 mg/dL 0.45  Calcium 8.4 - 40.9 mg/dL 81.1 (H)  Calcium Ionized 4.7 - 5.5 mg/dL 5.8 (H)  Albumin 3.5 - 5.2 g/dL 4.7  GFR >91.47 mL/min 55.14 (L)    Latest Reference Range & Units 02/01/23 09:19  VITD 30.00 - 100.00 ng/mL 42.18    Latest Reference Range & Units 02/01/23 09:19  PTH, Intact 16 - 77 pg/mL 105 (H)       DXA 11/07/2021 Results:   Lumbar spine L1-L4 Femoral neck (FN) 33% distal radius  T-Walsh 2.1 RFN: 0.1 LFN: 0.1 1.6     Assessment: the BMD is normal according to the [facility] classification for osteoporosis (see below).     ASSESSMENT/PLAN/RECOMMENDATIONS:   Primary Hyperparathyroidism:  -Patient is asymptomatic -Bone density was normal 11/2021 -24-hour urine collection for calcium was normal at 168 mg - Pt does MEET surgical criteria for surgical intervention based on GFR <60 .  -Repeat lab today show elevated PTH and ionized calcium, normal corrected serum calcium at 10.24 mg/DL -A referral to surgery has been placed   Recommendations Stay hydrated Avoid over-the-counter calcium tablets Consume 2-3 servings of dietary calcium a day     Follow-up in 6 months  Signed electronically by: Lyndle Herrlich, MD  [facility]  [facility] [address] [place] [place], [place] [address] Phone: [phone] FAX: [phone]   CC: Olive Bass, FNP [address] [place] [address] Phone: [phone] Fax: [phone]   Return to Endocrinology clinic as below: Future Appointments  Date Time Provider Department Center  02/01/2023  9:10 AM Davis Ambrosini, Konrad Dolores, MD [facility] None   02/02/2023  9:00 AM [name], LCSW [facility] None  02/07/2023  9:40 AM [facility] ANNUAL WELLNESS VISIT 1 [facility]  08/24/2023  8:20 AM Olive Bass, FNP [facility]

## 2023-02-02 ENCOUNTER — Ambulatory Visit (INDEPENDENT_AMBULATORY_CARE_PROVIDER_SITE_OTHER): Payer: Medicare Other | Admitting: Psychology

## 2023-02-02 DIAGNOSIS — F4323 Adjustment disorder with mixed anxiety and depressed mood: Secondary | ICD-10-CM

## 2023-02-02 DIAGNOSIS — F4310 Post-traumatic stress disorder, unspecified: Secondary | ICD-10-CM | POA: Diagnosis not present

## 2023-02-02 DIAGNOSIS — Z630 Problems in relationship with spouse or partner: Secondary | ICD-10-CM | POA: Diagnosis not present

## 2023-02-02 DIAGNOSIS — Z7189 Other specified counseling: Secondary | ICD-10-CM

## 2023-02-02 LAB — CALCIUM, IONIZED: Calcium, Ion: 5.8 mg/dL — ABNORMAL HIGH (ref 4.7–5.5)

## 2023-02-02 LAB — PARATHYROID HORMONE, INTACT (NO CA): PTH: 105 pg/mL — ABNORMAL HIGH (ref 16–77)

## 2023-02-02 NOTE — Progress Notes (Signed)
[facility] Counselor/Therapist Progress Note  Patient ID: Matthew Walsh, MRN: [id number]   Date: 02/02/23  Time Spent: 9:02 am - 9:59 am : 57 Minutes  Treatment Type: Individual Therapy.  Reported Symptoms: Interpersonal stressors & marital stressors.   Mental Status Exam: Appearance:  Casual     Behavior: Appropriate  Motor: Normal  Speech/Language:  Clear and Coherent  Affect: Congruent  Mood: normal  Thought process: normal  Thought content:   WNL  Sensory/Perceptual disturbances:   WNL  Orientation: oriented to person, place, time/date, and situation  Attention: Good  Concentration: Good  Memory: WNL  Fund of knowledge:  Good  Insight:   Good  Judgment:  Good  Impulse Control: Good   Risk Assessment: Danger to Self:  No Self-injurious Behavior: No Danger to Others: No Duty to Warn:no Physical Aggression / Violence:No  Access to Firearms a concern: No  Gang Involvement:No   Subjective:   Matthew Walsh participated from home, via video and consented to treatment. Therapist participated from home office. We met online due to COVID pandemic. Matthew Walsh [name] reviewed the events of the past week. He endorsed anxiety regarding pending invite from their son and noted often plans being last minute. He noted distress regarding this. We explored this during the session. He noted anxiety and trepidation for setting boundaries during the session. [name] noted this being difficult for him and the worry that his son might react poorly to any boundary setting or change in behavior from him, [name]. We explored this during the session. We worked on identifying his level of comfort with said boundaries with others aside of his son. We discussed working on shifting our perspective regarding setting reasonable boundaries with Ray and identifying Ray's level of responsibility in their dynamics. [name] noted [name]'s attempts to "fix" the relationship between her and their son  and her hope that he would discontinue this going forward. We continued to explore dynamics. Therapist provided psycho-education regarding anxiety and the effects of. Therapist encouraged [name] to identify boundaries for self, Ray, and to set boundaries regarding rumination and modeled this. Therapist validated and normalized [name] and [name]'s feelings and experiences and provided supportive therapy.   Interventions: Interpersonal & CBT.   Diagnosis:  Adjustment disorder with mixed anxiety and depressed mood  Counseling for marital and partner problems  PTSD (post-traumatic stress disorder)  Psychiatric Treatment: Yes , PCP - Burnell Blanks - NP. See chart for details.    Treatment Plan:  Client Abilities/Strengths Matthew Walsh is intelligent, forthcoming, and motivated for change.   Support System: Family and Friends.   Client Treatment Preferences Outpatient Therapy.   Client Statement of Needs Matthew Walsh would like to have positive relationship with his son and daughter in-law, communicate concerns positively and resolve conflict, employ empathy  Treatment Level Weekly  Symptoms  Interpersonal stressors, marital stressors, improve conflict resolution, set boundaries with son.  (Status: maintained)   Goals:   Matthew Walsh experiences symptoms of interpersonal stressors    Target Date: 11/04/23 Frequency: Weekly  Progress: 0 Modality: individual    Therapist will provide referrals for additional resources as appropriate.  Therapist will provide psycho-education regarding Matthew Walsh's diagnosis and corresponding treatment approaches and interventions. Licensed Clinical Social Worker, [place], LCSW will support the patient's ability to achieve the goals identified. will employ CBT, BA, Problem-solving, Solution Focused, Mindfulness,  coping skills, & other evidenced-based practices will be used to promote progress towards healthy functioning to help manage decrease symptoms associated  with her diagnosis.  Reduce overall level, frequency, and intensity of the feelings of depression, anxiety and panic evidenced by decreased overall symptoms from 6 to 7 days/week to 0 to 1 days/week per client report for at least 3 consecutive months. Verbally express understanding of the relationship between feelings of depression, anxiety and their impact on thinking patterns and behaviors. Verbalize an understanding of the role that distorted thinking plays in creating fears, excessive worry, and ruminations.    Matthew Walsh participated in the creation of the treatment plan)    [name], LCSW

## 2023-02-05 DIAGNOSIS — R972 Elevated prostate specific antigen [PSA]: Secondary | ICD-10-CM | POA: Diagnosis not present

## 2023-02-07 ENCOUNTER — Ambulatory Visit (INDEPENDENT_AMBULATORY_CARE_PROVIDER_SITE_OTHER): Payer: Medicare Other | Admitting: *Deleted

## 2023-02-07 DIAGNOSIS — Z0000 Encounter for general adult medical examination without abnormal findings: Secondary | ICD-10-CM | POA: Diagnosis not present

## 2023-02-07 NOTE — Progress Notes (Signed)
Subjective:   Matthew Walsh is a 72 y.o. male who presents for Medicare Annual/Subsequent preventive examination.  I connected with  Matthew Walsh on 02/07/23 by a audio enabled telemedicine application and verified that I am speaking with the correct person using two identifiers.  Patient Location: Home  Provider Location: Office/Clinic  I discussed the limitations of evaluation and management by telemedicine. The patient expressed understanding and agreed to proceed.   Review of Systems     Cardiac Risk Factors include: advanced age (>71men, >67 women);male gender;dyslipidemia;hypertension     Objective:    There were no vitals filed for this visit. There is no height or weight on file to calculate BMI.     02/07/2023    9:43 AM  Advanced Directives  Does Patient Have a Medical Advance Directive? Yes  Type of Estate agent of [place];Living will  Copy of Healthcare Power of Attorney in Chart? No - copy requested    Current Medications (verified) Outpatient Encounter Medications as of 02/07/2023  Medication Sig   prazosin (MINIPRESS) 5 MG capsule Take 1 capsule by mouth twice daily   rosuvastatin (CRESTOR) 20 MG tablet Take 1 tablet by mouth once daily   sertraline (ZOLOFT) 100 MG tablet Take 1 tablet by mouth once daily   valACYclovir (VALTREX) 1000 MG tablet TAKE 2 TABLETS BY MOUTH 1 TO 2 TIMES DAILY   No facility-administered encounter medications on file as of 02/07/2023.    Allergies (verified) Patient has no known allergies.   History: Past Medical History:  Diagnosis Date   Asthma    As a child   Diverticulitis    GERD (gastroesophageal reflux disease)    Hyperlipidemia    Hypertension    Past Surgical History:  Procedure Laterality Date   HERNIA REPAIR     TONSILLECTOMY AND ADENOIDECTOMY     Family History  Problem Relation Age of Onset   Diabetes Mother    Heart attack Mother    Miscarriages / [place] Mother     Alcohol abuse Father    Early death Father    Hearing loss Father    Heart disease Father    Hyperlipidemia Father    Hypertension Father    Alcohol abuse Brother    Alcohol abuse Maternal Grandmother    Diabetes Maternal Grandmother    Hearing loss Maternal Grandmother    Heart disease Maternal Grandmother    Hearing loss Maternal Grandfather    Heart disease Maternal Grandfather    Heart disease Paternal Grandmother    Heart disease Paternal Grandfather    Social History   Socioeconomic History   Marital status: Married    Spouse name: Not on file   Number of children: Not on file   Years of education: Not on file   Highest education level: Not on file  Occupational History   Not on file  Tobacco Use   Smoking status: Former   Smokeless tobacco: Never  Substance and Sexual Activity   Alcohol use: Yes    Comment: daily   Drug use: No   Sexual activity: Yes    Partners: Female  Other Topics Concern   Not on file  Social History Narrative   Fun- retired, just moved to area.   Neighborhood clubs   Social Determinants of Health   Financial Resource Strain: Low Risk  (01/16/2022)   Overall Financial Resource Strain (CARDIA)    Difficulty of Paying Living Expenses: Not hard at all  Food Insecurity:  No Food Insecurity (02/07/2023)   Hunger Vital Sign    Worried About Running Out of Food in the Last Year: Never true    Ran Out of Food in the Last Year: Never true  Transportation Needs: No Transportation Needs (02/07/2023)   PRAPARE - Administrator, Civil Service (Medical): No    Lack of Transportation (Non-Medical): No  Physical Activity: Sufficiently Active (02/07/2023)   Exercise Vital Sign    Days of Exercise per Week: 7 days    Minutes of Exercise per Session: 60 min  Stress: No Stress Concern Present (01/16/2022)   [employer] of Occupational Health - Occupational Stress Questionnaire    Feeling of Stress : Not at all  Social Connections:  Moderately Isolated (01/16/2022)   Social Connection and Isolation Panel [NHANES]    Frequency of Communication with Friends and Family: More than three times a week    Frequency of Social Gatherings with Friends and Family: More than three times a week    Attends Religious Services: Never    Database administrator or Organizations: No    Attends Engineer, structural: Never    Marital Status: Married    Tobacco Counseling Counseling given: Not Answered   Clinical Intake:  Pre-visit preparation completed: Yes  Pain : No/denies pain  Nutritional Risks: None Diabetes: No  How often do you need to have someone help you when you read instructions, pamphlets, or other written materials from your doctor or pharmacy?: 1 - Never  Activities of Daily Living    02/07/2023    9:57 AM  In your present state of health, do you have any difficulty performing the following activities:  Hearing? 1  Comment some hearing loss  Vision? 0  Difficulty concentrating or making decisions? 1  Walking or climbing stairs? 0  Dressing or bathing? 0  Doing errands, shopping? 0  Preparing Food and eating ? N  Using the Toilet? N  In the past six months, have you accidently leaked urine? N  Do you have problems with loss of bowel control? N  Managing your Medications? N  Managing your Finances? N  Housekeeping or managing your Housekeeping? N    Patient Care Team: Olive Bass, FNP as PCP - General (Internal Medicine)  Indicate any recent Medical Services you may have received from other than [facility] providers in the past year (date may be approximate).     Assessment:   This is a routine wellness examination for [name].  Hearing/Vision screen No results found.  Dietary issues and exercise activities discussed: Current Exercise Habits: Home exercise routine, Type of exercise: walking;Other - see comments ([place] Chi class once a week), Time (Minutes): 60, Frequency (Times/Week):  7, Weekly Exercise (Minutes/Week): 420, Intensity: Moderate, Exercise limited by: None identified   Goals Addressed   None    Depression Screen    02/07/2023    9:56 AM 08/22/2022    8:31 AM 02/14/2022    8:40 AM 01/16/2022    9:46 AM 08/19/2021   10:41 AM 12/01/2019    9:58 AM 08/20/2017    2:18 PM  PHQ 2/9 Scores  PHQ - 2 Score 0 0 0 0 0 0 0    Fall Risk    02/07/2023    9:45 AM 08/22/2022    8:31 AM 02/14/2022    8:40 AM 01/16/2022    9:45 AM 08/19/2021   10:40 AM  Fall Risk   Falls in the past year?  0 0 0 0 0  Number falls in past yr: 0 0 0 0 0  Injury with Fall? 0 0 0 0 0  Risk for fall due to : No Fall Risks No Fall Risks No Fall Risks No Fall Risks No Fall Risks  Follow up Falls evaluation completed Falls evaluation completed Falls evaluation completed Falls evaluation completed Falls evaluation completed    FALL RISK PREVENTION PERTAINING TO THE HOME:  Any stairs in or around the home? Yes  If so, are there any without handrails? No  Home free of loose throw rugs in walkways, pet beds, electrical cords, etc? Yes  Adequate lighting in your home to reduce risk of falls? Yes   ASSISTIVE DEVICES UTILIZED TO PREVENT FALLS:  Life alert? No  Use of a cane, walker or w/c? No  Grab bars in the bathroom? Yes  Shower chair or bench in shower? Yes  Elevated toilet seat or a handicapped toilet? No   TIMED UP AND GO:  Was the test performed?  No, audio visit .   Cognitive Function:        02/07/2023   10:01 AM 01/16/2022    9:48 AM  6CIT Screen  What Year? 0 points   What month? 0 points   What time? 0 points 0 points  Count back from 20 0 points 0 points  Months in reverse 0 points 0 points  Repeat phrase 0 points 0 points  Total Score 0 points     Immunizations Immunization History  Administered Date(s) Administered   Fluad Quad(high Dose 65+) 08/19/2021   Influenza Inj Mdck Quad With Preservative 07/19/2022   Influenza, High Dose Seasonal PF 05/23/2019,  07/02/2020   Moderna SARS-COV2 Booster Vaccination 07/19/2022   PFIZER(Purple Top)SARS-COV-2 Vaccination 11/09/2019, 12/04/2019, 07/02/2020   Pfizer Covid-19 Vaccine Bivalent Booster 76yrs & up 09/13/2021   Pneumococcal Conjugate-13 01/16/2018   Pneumococcal Polysaccharide-23 03/04/2019   Zoster Recombinat (Shingrix) 05/23/2019, 07/22/2019    TDAP status: Due, Education has been provided regarding the importance of this vaccine. Advised may receive this vaccine at local pharmacy or Health Dept. Aware to provide a copy of the vaccination record if obtained from local pharmacy or Health Dept. Verbalized acceptance and understanding.  Flu Vaccine status: Up to date  Pneumococcal vaccine status: Up to date  Covid-19 vaccine status: Information provided on how to obtain vaccines.   Qualifies for Shingles Vaccine? Yes   Zostavax completed No   Shingrix Completed?: Yes  Screening Tests Health Maintenance  Topic Date Due   DTaP/Tdap/Td (1 - Tdap) Never done   COVID-19 Vaccine (6 - 2023-24 season) 09/13/2022   Medicare Annual Wellness (AWV)  01/17/2023   INFLUENZA VACCINE  05/03/2023   COLONOSCOPY (Pts 45-10yrs Insurance coverage will need to be confirmed)  10/03/2023   Pneumonia Vaccine 9+ Years old  Completed   Hepatitis C Screening  Completed   Zoster Vaccines- Shingrix  Completed   HPV VACCINES  Aged Out    Health Maintenance  Health Maintenance Due  Topic Date Due   DTaP/Tdap/Td (1 - Tdap) Never done   COVID-19 Vaccine (6 - 2023-24 season) 09/13/2022   Medicare Annual Wellness (AWV)  01/17/2023    Colorectal cancer screening: Type of screening: Colonoscopy. Completed 10/02/13. Repeat every 10 years  Lung Cancer Screening: (Low Dose CT Chest recommended if Age 48-80 years, 30 pack-year currently smoking OR have quit w/in 15years.) does not qualify.   Additional Screening:  Hepatitis C Screening: does qualify; Completed 01/16/18  Vision Screening: Recommended annual  ophthalmology exams for early detection of glaucoma and other disorders of the eye. Is the patient up to date with their annual eye exam?  Yes  Who is the provider or what is the name of the office in which the patient attends annual eye exams? Dr. [place] If pt is not established with a provider, would they like to be referred to a provider to establish care? No .   Dental Screening: Recommended annual dental exams for proper oral hygiene  Community Resource Referral / Chronic Care Management: CRR required this visit?  No   CCM required this visit?  No      Plan:     I have personally reviewed and noted the following in the patient's chart:   Medical and social history Use of alcohol, tobacco or illicit drugs  Current medications and supplements including opioid prescriptions. Patient is not currently taking opioid prescriptions. Functional ability and status Nutritional status Physical activity Advanced directives List of other physicians Hospitalizations, surgeries, and ER visits in previous 12 months Vitals Screenings to include cognitive, depression, and falls Referrals and appointments  In addition, I have reviewed and discussed with patient certain preventive protocols, quality metrics, and best practice recommendations. A written personalized care plan for preventive services as well as general preventive health recommendations were provided to patient.   Due to this being a telephonic visit, the after visit summary with patients personalized plan was offered to patient via mail or my-chart.  Patient would like to access on my-chart.  Donne Anon, [place]   02/07/2023   Nurse Notes: None

## 2023-02-07 NOTE — Patient Instructions (Signed)
Matthew Walsh , Thank you for taking time to come for your Medicare Wellness Visit. I appreciate your ongoing commitment to your health goals. Please review the following plan we discussed and let me know if I can assist you in the future.   These are the goals we discussed:  Goals   None     This is a list of the screening recommended for you and due dates:  Health Maintenance  Topic Date Due   DTaP/Tdap/Td vaccine (1 - Tdap) Never done   COVID-19 Vaccine (6 - 2023-24 season) 09/13/2022   Flu Shot  05/03/2023   Colon Cancer Screening  10/03/2023   Medicare Annual Wellness Visit  02/07/2024   Pneumonia Vaccine  Completed   Hepatitis C Screening: USPSTF Recommendation to screen - Ages 18-79 yo.  Completed   Zoster (Shingles) Vaccine  Completed   HPV Vaccine  Aged Out     Next appointment: Follow up in one year for your annual wellness visit.   Preventive Care 51 Years and Older, Male Preventive care refers to lifestyle choices and visits with your health care provider that can promote health and wellness. What does preventive care include? A yearly physical exam. This is also called an annual well check. Dental exams once or twice a year. Routine eye exams. Ask your health care provider how often you should have your eyes checked. Personal lifestyle choices, including: Daily care of your teeth and gums. Regular physical activity. Eating a healthy diet. Avoiding tobacco and drug use. Limiting alcohol use. Practicing safe sex. Taking low doses of aspirin every day. Taking vitamin and mineral supplements as recommended by your health care provider. What happens during an annual well check? The services and screenings done by your health care provider during your annual well check will depend on your age, overall health, lifestyle risk factors, and family history of disease. Counseling  Your health care provider may ask you questions about your: Alcohol use. Tobacco use. Drug  use. Emotional well-being. Home and relationship well-being. Sexual activity. Eating habits. History of falls. Memory and ability to understand (cognition). Work and work Astronomer. Screening  You may have the following tests or measurements: Height, weight, and BMI. Blood pressure. Lipid and cholesterol levels. These may be checked every 5 years, or more frequently if you are over 36 years old. Skin check. Lung cancer screening. You may have this screening every year starting at age 95 if you have a 30-pack-year history of smoking and currently smoke or have quit within the past 15 years. Fecal occult blood test (FOBT) of the stool. You may have this test every year starting at age 22. Flexible sigmoidoscopy or colonoscopy. You may have a sigmoidoscopy every 5 years or a colonoscopy every 10 years starting at age 46. Prostate cancer screening. Recommendations will vary depending on your family history and other risks. Hepatitis C blood test. Hepatitis B blood test. Sexually transmitted disease (STD) testing. Diabetes screening. This is done by checking your blood sugar (glucose) after you have not eaten for a while (fasting). You may have this done every 1-3 years. Abdominal aortic aneurysm (AAA) screening. You may need this if you are a current or former smoker. Osteoporosis. You may be screened starting at age 43 if you are at high risk. Talk with your health care provider about your test results, treatment options, and if necessary, the need for more tests. Vaccines  Your health care provider may recommend certain vaccines, such as: Influenza vaccine. This  is recommended every year. Tetanus, diphtheria, and acellular pertussis (Tdap, Td) vaccine. You may need a Td booster every 10 years. Zoster vaccine. You may need this after age 37. Pneumococcal 13-valent conjugate (PCV13) vaccine. One dose is recommended after age 39. Pneumococcal polysaccharide (PPSV23) vaccine. One dose is  recommended after age 43. Talk to your health care provider about which screenings and vaccines you need and how often you need them. This information is not intended to replace advice given to you by your health care provider. Make sure you discuss any questions you have with your health care provider. Document Released: 10/15/2015 Document Revised: 06/07/2016 Document Reviewed: 07/20/2015 Elsevier Interactive Patient Education  2017 [place] Prevention in the Home Falls can cause injuries. They can happen to people of all ages. There are many things you can do to make your home safe and to help prevent falls. What can I do on the outside of my home? Regularly fix the edges of walkways and driveways and fix any cracks. Remove anything that might make you trip as you walk through a door, such as a raised step or threshold. Trim any bushes or trees on the path to your home. Use bright outdoor lighting. Clear any walking paths of anything that might make someone trip, such as rocks or tools. Regularly check to see if handrails are loose or broken. Make sure that both sides of any steps have handrails. Any raised decks and porches should have guardrails on the edges. Have any leaves, snow, or ice cleared regularly. Use sand or salt on walking paths during winter. Clean up any spills in your garage right away. This includes oil or grease spills. What can I do in the bathroom? Use night lights. Install grab bars by the toilet and in the tub and shower. Do not use towel bars as grab bars. Use non-skid mats or decals in the tub or shower. If you need to sit down in the shower, use a plastic, non-slip stool. Keep the floor dry. Clean up any water that spills on the floor as soon as it happens. Remove soap buildup in the tub or shower regularly. Attach bath mats securely with double-sided non-slip rug tape. Do not have throw rugs and other things on the floor that can make you  trip. What can I do in the bedroom? Use night lights. Make sure that you have a light by your bed that is easy to reach. Do not use any sheets or blankets that are too big for your bed. They should not hang down onto the floor. Have a firm chair that has side arms. You can use this for support while you get dressed. Do not have throw rugs and other things on the floor that can make you trip. What can I do in the kitchen? Clean up any spills right away. Avoid walking on wet floors. Keep items that you use a lot in easy-to-reach places. If you need to reach something above you, use a strong step stool that has a grab bar. Keep electrical cords out of the way. Do not use floor polish or wax that makes floors slippery. If you must use wax, use non-skid floor wax. Do not have throw rugs and other things on the floor that can make you trip. What can I do with my stairs? Do not leave any items on the stairs. Make sure that there are handrails on both sides of the stairs and use them. Fix handrails that  are broken or loose. Make sure that handrails are as long as the stairways. Check any carpeting to make sure that it is firmly attached to the stairs. Fix any carpet that is loose or worn. Avoid having throw rugs at the top or bottom of the stairs. If you do have throw rugs, attach them to the floor with carpet tape. Make sure that you have a light switch at the top of the stairs and the bottom of the stairs. If you do not have them, ask someone to add them for you. What else can I do to help prevent falls? Wear shoes that: Do not have high heels. Have rubber bottoms. Are comfortable and fit you well. Are closed at the toe. Do not wear sandals. If you use a stepladder: Make sure that it is fully opened. Do not climb a closed stepladder. Make sure that both sides of the stepladder are locked into place. Ask someone to hold it for you, if possible. Clearly mark and make sure that you can  see: Any grab bars or handrails. First and last steps. Where the edge of each step is. Use tools that help you move around (mobility aids) if they are needed. These include: Canes. Walkers. Scooters. Crutches. Turn on the lights when you go into a dark area. Replace any light bulbs as soon as they burn out. Set up your furniture so you have a clear path. Avoid moving your furniture around. If any of your floors are uneven, fix them. If there are any pets around you, be aware of where they are. Review your medicines with your doctor. Some medicines can make you feel dizzy. This can increase your chance of falling. Ask your doctor what other things that you can do to help prevent falls. This information is not intended to replace advice given to you by your health care provider. Make sure you discuss any questions you have with your health care provider. Document Released: 07/15/2009 Document Revised: 02/24/2016 Document Reviewed: 10/23/2014 Elsevier Interactive Patient Education  2017 [employer].

## 2023-02-12 DIAGNOSIS — R972 Elevated prostate specific antigen [PSA]: Secondary | ICD-10-CM | POA: Diagnosis not present

## 2023-02-16 ENCOUNTER — Ambulatory Visit (INDEPENDENT_AMBULATORY_CARE_PROVIDER_SITE_OTHER): Payer: Medicare Other | Admitting: Psychology

## 2023-02-16 DIAGNOSIS — Z7189 Other specified counseling: Secondary | ICD-10-CM

## 2023-02-16 DIAGNOSIS — Z630 Problems in relationship with spouse or partner: Secondary | ICD-10-CM | POA: Diagnosis not present

## 2023-02-16 DIAGNOSIS — F4310 Post-traumatic stress disorder, unspecified: Secondary | ICD-10-CM

## 2023-02-16 DIAGNOSIS — F4323 Adjustment disorder with mixed anxiety and depressed mood: Secondary | ICD-10-CM | POA: Diagnosis not present

## 2023-02-16 NOTE — Progress Notes (Signed)
[facility] Counselor/Therapist Progress Note  Patient ID: Matthew Walsh, MRN: [id number]   Date: 02/16/23  Time Spent: 8:03 am - 9:00 am : 57 Minutes  Treatment Type: Individual Therapy.  Reported Symptoms: Interpersonal stressors & marital stressors.   Mental Status Exam: Appearance:  Casual     Behavior: Appropriate  Motor: Normal  Speech/Language:  Clear and Coherent  Affect: Congruent  Mood: normal  Thought process: normal  Thought content:   WNL  Sensory/Perceptual disturbances:   WNL  Orientation: oriented to person, place, time/date, and situation  Attention: Good  Concentration: Good  Memory: WNL  Fund of knowledge:  Good  Insight:   Good  Judgment:  Good  Impulse Control: Good   Risk Assessment: Danger to Self:  No Self-injurious Behavior: No Danger to Others: No Duty to Warn:no Physical Aggression / Violence:No  Access to Firearms a concern: No  Gang Involvement:No   Subjective:   [name] participated from home, via video and consented to treatment. Therapist participated from home office. We met online due to COVID pandemic. [name] [name] reviewed the events of the past week. [name] noted feelings of frustration regarding her son's prioritization of his dogs and not addressing their behavior. She noted her son not prioritizing time together with family. Bill noted difficulty with his aging and limited ability due to health and aging process. We worked on processed this during the session. Bill noted his effort in setting boundaries for self regarding jumping to help others. We worked on processing this and noted ways to provide emotional support without "fixing" the issue. Therapist provided handouts for reference and review. Bill noted anxiety regarding upcoming testing that could be cancer related. We processed this during the session. Therapist validated Garek's feelings and anxiety while working on identifying ways to manage this.  Therapist encouraged Bill to manage his anxiety via relaxation and boundary setting. We discussed ways to keep mindfulness his boundaries for self and others. Therapist praised Publishing rights manager engagement in the session and working on goals between sessions. Therapist validated and normalized Bill and [name]'s feelings and experiences and provided supportive therapy.   Interventions: Interpersonal & CBT.   Diagnosis:  Adjustment disorder with mixed anxiety and depressed mood  Counseling for marital and partner problems  PTSD (post-traumatic stress disorder)  Psychiatric Treatment: Yes , PCP - Burnell Blanks - NP. See chart for details.    Treatment Plan:  Client Abilities/Strengths Keyante is intelligent, forthcoming, and motivated for change.   Support System: Family and Friends.   Client Treatment Preferences Outpatient Therapy.   Client Statement of Needs Tiofilo would like to have positive relationship with his son and daughter in-law, communicate concerns positively and resolve conflict, employ empathy  Treatment Level Weekly  Symptoms  Interpersonal stressors, marital stressors, improve conflict resolution, set boundaries with son.  (Status: maintained)   Goals:   [name] experiences symptoms of interpersonal stressors    Target Date: 11/04/23 Frequency: Weekly  Progress: 0 Modality: individual    Therapist will provide referrals for additional resources as appropriate.  Therapist will provide psycho-education regarding Carston's diagnosis and corresponding treatment approaches and interventions. Licensed Clinical Social Worker, [place], LCSW will support the patient's ability to achieve the goals identified. will employ CBT, BA, Problem-solving, Solution Focused, Mindfulness,  coping skills, & other evidenced-based practices will be used to promote progress towards healthy functioning to help manage decrease symptoms associated with her diagnosis.   Reduce  overall level, frequency, and intensity of the  feelings of depression, anxiety and panic evidenced by decreased overall symptoms from 6 to 7 days/week to 0 to 1 days/week per client report for at least 3 consecutive months. Verbally express understanding of the relationship between feelings of depression, anxiety and their impact on thinking patterns and behaviors. Verbalize an understanding of the role that distorted thinking plays in creating fears, excessive worry, and ruminations.    [name] participated in the creation of the treatment plan)    [name], LCSW

## 2023-02-27 DIAGNOSIS — N400 Enlarged prostate without lower urinary tract symptoms: Secondary | ICD-10-CM | POA: Diagnosis not present

## 2023-02-27 DIAGNOSIS — R972 Elevated prostate specific antigen [PSA]: Secondary | ICD-10-CM | POA: Diagnosis not present

## 2023-03-06 DIAGNOSIS — E213 Hyperparathyroidism, unspecified: Secondary | ICD-10-CM | POA: Diagnosis not present

## 2023-03-07 ENCOUNTER — Other Ambulatory Visit (HOSPITAL_COMMUNITY): Payer: Self-pay | Admitting: Surgery

## 2023-03-07 ENCOUNTER — Other Ambulatory Visit: Payer: Self-pay

## 2023-03-07 DIAGNOSIS — E213 Hyperparathyroidism, unspecified: Secondary | ICD-10-CM

## 2023-03-13 ENCOUNTER — Encounter (HOSPITAL_COMMUNITY)
Admission: RE | Admit: 2023-03-13 | Discharge: 2023-03-13 | Disposition: A | Discharge status: Home / Self Care | Payer: Medicare Other | Source: Ambulatory Visit | Attending: Surgery | Admitting: Surgery

## 2023-03-13 ENCOUNTER — Ambulatory Visit (HOSPITAL_COMMUNITY)
Admission: RE | Admit: 2023-03-13 | Discharge: 2023-03-13 | Disposition: A | Discharge status: Home / Self Care | Payer: Medicare Other | Source: Ambulatory Visit | Attending: Surgery | Admitting: Surgery

## 2023-03-13 DIAGNOSIS — E213 Hyperparathyroidism, unspecified: Secondary | ICD-10-CM | POA: Insufficient documentation

## 2023-03-13 DIAGNOSIS — E210 Primary hyperparathyroidism: Secondary | ICD-10-CM | POA: Diagnosis not present

## 2023-03-13 DIAGNOSIS — Z0389 Encounter for observation for other suspected diseases and conditions ruled out: Secondary | ICD-10-CM | POA: Diagnosis not present

## 2023-03-13 MED ORDER — TECHNETIUM TC 99M SESTAMIBI - CARDIOLITE
25.3000 | Freq: Once | INTRAVENOUS | Status: AC | PRN
  Administered 2023-03-13: 25.3 via INTRAVENOUS

## 2023-03-14 NOTE — Progress Notes (Signed)
USN is normal with no evidence of an enlarged parathyroid gland.  Await reading on sestamibi scan.  tmg  Darnell Level, MD [facility] practice Office: [phone]

## 2023-03-20 ENCOUNTER — Other Ambulatory Visit (HOSPITAL_COMMUNITY): Payer: Self-pay | Admitting: Surgery

## 2023-03-20 DIAGNOSIS — E213 Hyperparathyroidism, unspecified: Secondary | ICD-10-CM

## 2023-03-20 NOTE — Progress Notes (Signed)
USN and sestamibi scans are negative for adenoma.  Will request 4D-CT scan of neck with parathyroid protocol.  Tresa Endo - please arrange CT scan.  tmg  Darnell Level, MD [facility] practice Office: [phone]

## 2023-03-21 ENCOUNTER — Ambulatory Visit (HOSPITAL_COMMUNITY)
Admission: RE | Admit: 2023-03-21 | Discharge: 2023-03-21 | Disposition: A | Discharge status: Home / Self Care | Payer: Medicare Other | Source: Ambulatory Visit | Attending: Surgery | Admitting: Surgery

## 2023-03-21 DIAGNOSIS — D351 Benign neoplasm of parathyroid gland: Secondary | ICD-10-CM | POA: Diagnosis not present

## 2023-03-21 DIAGNOSIS — E079 Disorder of thyroid, unspecified: Secondary | ICD-10-CM | POA: Diagnosis not present

## 2023-03-21 DIAGNOSIS — E213 Hyperparathyroidism, unspecified: Secondary | ICD-10-CM | POA: Insufficient documentation

## 2023-03-21 DIAGNOSIS — M47812 Spondylosis without myelopathy or radiculopathy, cervical region: Secondary | ICD-10-CM | POA: Diagnosis not present

## 2023-03-21 MED ORDER — IOHEXOL 350 MG/ML SOLN
100.0000 mL | Freq: Once | INTRAVENOUS | Status: AC | PRN
  Administered 2023-03-21: 100 mL via INTRAVENOUS

## 2023-03-30 ENCOUNTER — Ambulatory Visit: Payer: Self-pay | Admitting: Surgery

## 2023-03-30 NOTE — H&P (Signed)
REFERRING PHYSICIAN: Shamleffer, Ibtehal  PROVIDER: Deaglan Lile Myra Rude, MD   Chief Complaint: New Consultation ( Primary hyperparathyroidism)  History of Present Illness:  Patient is referred by Dr. Lesly Dukes for surgical evaluation and recommendations regarding management of primary hyperparathyroidism. Patient has had longstanding elevated serum calcium levels. These have ranged from 10.4 to as high as 11.1. His most recent level was elevated at 10.8. Intact PTH levels have ranged as high as 134 with his most recent level being 105. 24-hour urine collection for calcium was normal at 168. 25-hydroxy vitamin D level is normal at 42.18. Patient has noted fatigue. He notes urinary frequency. He has had a bone density scan in the last year or 2 which was normal. He has had no recent fractures. He denies any history of nephrolithiasis. Patient has had no prior head or neck surgery. There is a family history of medical thyroid disease in the patient's mother. Patient has had no imaging studies. He presents today accompanied by his wife for consultation.  Review of Systems: A complete review of systems was obtained from the patient. I have reviewed this information and discussed as appropriate with the patient. See HPI as well for other ROS.  Review of Systems  Constitutional: Positive for malaise/fatigue.  HENT: Negative.  Eyes: Negative.  Respiratory: Negative.  Cardiovascular: Negative.  Gastrointestinal: Negative.  Genitourinary: Positive for frequency.  Musculoskeletal: Negative.  Skin: Negative.  Neurological: Negative.  Endo/Heme/Allergies: Negative.  Psychiatric/Behavioral: Negative.    Medical History: Past Medical History:  Diagnosis Date  GERD (gastroesophageal reflux disease)  Hyperlipidemia  Hypertension   Patient Active Problem List  Diagnosis  Hyperparathyroidism (CMS/HHS-HCC)   Past Surgical History:  Procedure Laterality Date  HERNIA REPAIR   TONSILLECTOMY  VASECTOMY    No Known Allergies  Current Outpatient Medications on File Prior to Visit  Medication Sig Dispense Refill  prazosin (MINIPRESS) 5 MG capsule Take 5 mg by mouth 2 (two) times daily  rosuvastatin (CRESTOR) 20 MG tablet Take 20 mg by mouth once daily  sertraline (ZOLOFT) 100 MG tablet Take 1 tablet by mouth once daily   No current facility-administered medications on file prior to visit.   Family History  Problem Relation Age of Onset  Diabetes Mother  High blood pressure (Hypertension) Father  Coronary Artery Disease (Blocked arteries around heart) Father    Social History   Tobacco Use  Smoking Status Former  Types: Cigarettes  Start date: 1984  Smokeless Tobacco Never    Social History   Socioeconomic History  Marital status: Married  Tobacco Use  Smoking status: Former  Types: Cigarettes  Start date: 1984  Smokeless tobacco: Never  Substance and Sexual Activity  Alcohol use: Not Currently  Drug use: Never   Social Determinants of Health   Financial Resource Strain: Low Risk (01/16/2022)  Received from [facility]  Overall Financial Resource Strain (CARDIA)  Difficulty of Paying Living Expenses: Not hard at all  Food Insecurity: No Food Insecurity (02/07/2023)  [facility]  Hunger Vital Sign  Worried About Running Out of Food in the Last Year: Never true  Ran Out of Food in the Last Year: Never true  Transportation Needs: No Transportation Needs (02/07/2023)  [facility] from [facility] - Transportation  Lack of Transportation (Medical): No  Lack of Transportation (Non-Medical): No  Physical Activity: Sufficiently Active (02/07/2023)  Received from [facility]  Exercise Vital Sign  Days of Exercise per Week: 7 days  Minutes of  Exercise per Session: 60 min  Stress: No Stress Concern Present (01/16/2022)  Received from [facility] - Occupational Stress Questionnaire   Feeling of Stress : Not at all  Social Connections: Moderately Isolated (01/16/2022)  Received from [facility]  Social Connection and Isolation Panel [NHANES]  Frequency of Communication with Friends and Family: More than three times a week  Frequency of Social Gatherings with Friends and Family: More than three times a week  Attends Religious Services: Never  Database administrator or Organizations: No  Attends Engineer, structural: Never  Marital Status: Married   Objective:   Vitals:  BP: 124/71  Pulse: 76  Temp: 36.7 C (98 F)  SpO2: 98%  Weight: 89.4 kg (197 lb)  Height: 177.8 cm (5\' 10" )   Body mass index is 28.27 kg/m.  Physical Exam   GENERAL APPEARANCE Comfortable, no acute issues Development: normal Gross deformities: none  SKIN Rash, lesions, ulcers: none Induration, erythema: none Nodules: none palpable  EYES Conjunctiva and lids: normal Pupils: equal and reactive  EARS, NOSE, MOUTH, THROAT External ears: no lesion or deformity External nose: no lesion or deformity Hearing: grossly normal  NECK Symmetric: yes Trachea: midline Thyroid: no palpable nodules in the thyroid bed  ABDOMEN Not assessed  GENITOURINARY/RECTAL Not assessed  MUSCULOSKELETAL Station and gait: normal Digits and nails: no clubbing or cyanosis Muscle strength: grossly normal all extremities Range of motion: grossly normal all extremities Deformity: none  LYMPHATIC Cervical: none palpable Supraclavicular: none palpable  PSYCHIATRIC Oriented to person, place, and time: yes Mood and affect: normal for situation Judgment and insight: appropriate for situation   Assessment and Plan:   Hyperparathyroidism (CMS/HHS-HCC)  Patient is referred by his endocrinologist, Dr. Lesly Dukes, for surgical evaluation and recommendations regarding management of primary hyperparathyroidism.  Patient provided with a copy of "Parathyroid Surgery: Treatment for Your  Parathyroid Gland Problem", published by [employer], 12 pages. Book reviewed and explained to patient during visit today.  Today we reviewed his clinical history. We reviewed his recent laboratory studies. We discussed further evaluation with imaging studies to include an ultrasound and a nuclear medicine parathyroid scan with sestamibi. We discussed the fact that most people have single gland disease. We discussed minimally invasive parathyroidectomy. We discussed doing this as an outpatient procedure. We discussed the postoperative recovery to be anticipated. We discussed the potential need for a 4D CT scan with parathyroid protocol if the initial studies were not conclusive and confirming his diagnosis and indicating the position of the adenoma. The patient and his wife understand and agree to proceed with testing.  Patient will undergo the above studies. We will contact him with the results when they are available and make plans for further management at that time.   Darnell Level, MD [facility] practice Office: [phone]

## 2023-03-30 NOTE — Progress Notes (Signed)
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  Will plan to proceed with minimally invasive parathyroidectomy as an outpatient procedure as discussed in the office.  Tresa Endo - please have schedulers contact patient and schedule surgery.  Darnell Level, MD [facility] practice Office: [phone]

## 2023-04-09 ENCOUNTER — Telehealth: Payer: Self-pay
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
## 2023-06-27 NOTE — Progress Notes (Addendum)
COVID Vaccine received:  []  No [x]  Yes Date of any COVID positive Test in last 90 days: no PCP - Ria Clock FNP Cardiologist - no  Chest x-ray -  EKG -  07/02/23 Epic Stress Test - no ECHO -  Cardiac Cath -   Bowel Prep - [x]  No  []   Yes ______  Pacemaker / ICD device [x]  No []  Yes   Spinal Cord Stimulator:[x]  No []  Yes       History of Sleep Apnea? []  No [x]  Yes   CPAP used?- [x]  No []  Yes    Does the patient monitor blood sugar?          [x]  No []  Yes  []  N/A  Patient has: [x]  NO Hx DM   []  Pre-DM                 []  DM1  []   DM2 Does patient have a [employer] or Dexacom? []  No []  Yes   Fasting Blood Sugar Ranges-  Checks Blood Sugar _____ times a day  GLP1 agonist / usual dose - no GLP1 instructions:  SGLT-2 inhibitors / usual dose -no  SGLT-2 instructions:   Blood Thinner / Instructions:no Aspirin Instructions:no  Comments:   Activity level: Patient is able  to climb a flight of stairs without difficulty; [x]  No CP  [x]  No SOB, ___   Patient can  perform ADLs without assistance.   Anesthesia review:   Patient denies shortness of breath, fever, cough and chest pain at PAT appointment.  Patient verbalized understanding and agreement to the Pre-Surgical Instructions that were given to them at this PAT appointment. Patient was also educated of the need to review these PAT instructions again prior to his/her surgery.I reviewed the appropriate phone numbers to call if they have any and questions or concerns.

## 2023-06-27 NOTE — Patient Instructions (Addendum)
SURGICAL WAITING ROOM VISITATION  Patients having surgery or a procedure may have no more than 2 support people in the waiting area - these visitors may rotate.    Children under the age of 48 must have an adult with them who is not the patient.  Due to an increase in RSV and influenza rates and associated hospitalizations, children ages 9 and under may not visit patients in [facility] hospitals.  If the patient needs to stay at the hospital during part of their recovery, the visitor guidelines for inpatient rooms apply. Pre-op nurse will coordinate an appropriate time for 1 support person to accompany patient in pre-op.  This support person may not rotate.    Please refer to the [facility] website for the visitor guidelines for Inpatients (after your surgery is over and you are in a regular room).       Your procedure is scheduled on: 07/11/23   Report to [facility] Main Entrance    Report to admitting at 6:14 AM   Call this number if you have problems the morning of surgery [phone]   Do not eat food :After Midnight.   After Midnight you may have the following liquids until _5:30 AM DAY OF SURGERY  Water Non-Citrus Juices (without pulp, NO RED-Apple, White grape, White cranberry) Black Coffee (NO MILK/CREAM OR CREAMERS, sugar ok)  Clear Tea (NO MILK/CREAM OR CREAMERS, sugar ok) regular and decaf                             Plain Jell-O (NO RED)                                           Fruit ices (not with fruit pulp, NO RED)                                     Popsicles (NO RED)                                                               Sports drinks like Gatorade (NO RED)                    Oral Hygiene is also important to reduce your risk of infection.                                    Remember - BRUSH YOUR TEETH THE MORNING OF SURGERY WITH YOUR REGULAR TOOTHPASTE   Stop all vitamins and herbal supplements 7 days before surgery.   Take these  medicines the morning of surgerywith sips of water: Minipress, Rosuvastatin, Sertraline             You may not have any metal on your body including hair pins, jewelry, and body piercing             Do not wear make-up, lotions, powders, cologne, or deodorant  Men may shave face and neck.   Do not bring valuables to the hospital. [facility] IS NOT             RESPONSIBLE   FOR VALUABLES.   Contacts, glasses, dentures or bridgework may not be worn into surgery.  DO NOT BRING YOUR HOME MEDICATIONS TO THE HOSPITAL. PHARMACY WILL DISPENSE MEDICATIONS LISTED ON YOUR MEDICATION LIST TO YOU DURING YOUR ADMISSION IN THE HOSPITAL!    Patients discharged on the day of surgery will not be allowed to drive home.  Someone NEEDS to stay with you for the first 24 hours after anesthesia.   Special Instructions: Bring a copy of your healthcare power of attorney and living will documents the day of surgery if you haven't scanned them before.              Please read over the following fact sheets you were given: IF YOU HAVE QUESTIONS ABOUT YOUR PRE-OP INSTRUCTIONS PLEASE CALL [phone] Matthew Walsh   If you received a COVID test during your pre-op visit  it is requested that you wear a mask when out in public, stay away from anyone that may not be feeling well and notify your surgeon if you develop symptoms. If you test positive for Covid or have been in contact with anyone that has tested positive in the last 10 days please notify you surgeon.    [facility] - Preparing for Surgery Before surgery, you can play an important role.  Because skin is not sterile, your skin needs to be as free of germs as possible.  You can reduce the number of germs on your skin by washing with CHG (chlorahexidine gluconate) soap before surgery.  CHG is an antiseptic cleaner which kills germs and bonds with the skin to continue killing germs even after washing. Please DO NOT use if you have an allergy to CHG or  antibacterial soaps.  If your skin becomes reddened/irritated stop using the CHG and inform your nurse when you arrive at Short Stay. Do not shave (including legs and underarms) for at least 48 hours prior to the first CHG shower.  You may shave your face/neck.  Please follow these instructions carefully:  1.  Shower with CHG Soap the night before surgery and the  morning of surgery.  2.  If you choose to wash your hair, wash your hair first as usual with your normal  shampoo.  3.  After you shampoo, rinse your hair and body thoroughly to remove the shampoo.                             4.  Use CHG as you would any other liquid soap.  You can apply chg directly to the skin and wash.  Gently with a scrungie or clean washcloth.  5.  Apply the CHG Soap to your body ONLY FROM THE NECK DOWN.   Do   not use on face/ open                           Wound or open sores. Avoid contact with eyes, ears mouth and   genitals (private parts).                       Wash face,  Genitals (private parts) with your normal soap.  6.  Wash thoroughly, paying special attention to the area where your    surgery  will be performed.  7.  Thoroughly rinse your body with warm water from the neck down.  8.  DO NOT shower/wash with your normal soap after using and rinsing off the CHG Soap.                9.  Pat yourself dry with a clean towel.            10.  Wear clean pajamas.            11.  Place clean sheets on your bed the night of your first shower and do not  sleep with pets. Day of Surgery : Do not apply any lotions/deodorants the morning of surgery.  Please wear clean clothes to the hospital/surgery center.  FAILURE TO FOLLOW THESE INSTRUCTIONS MAY RESULT IN THE CANCELLATION OF YOUR SURGERY  PATIENT SIGNATURE_________________________________  NURSE SIGNATURE__________________________________  ________________________________________________________________________

## 2023-07-02 ENCOUNTER — Encounter (HOSPITAL_COMMUNITY)
Admission: RE | Admit: 2023-07-02 | Discharge: 2023-07-02 | Disposition: A | Discharge status: Home / Self Care | Payer: Medicare Other | Source: Ambulatory Visit | Attending: Surgery | Admitting: Surgery

## 2023-07-02 ENCOUNTER — Other Ambulatory Visit: Payer: Self-pay

## 2023-07-02 ENCOUNTER — Encounter (HOSPITAL_COMMUNITY): Payer: Self-pay

## 2023-07-02 VITALS — BP 172/86 | HR 56 | Temp 98.4°F | Resp 16 | Ht 70.0 in | Wt 197.0 lb

## 2023-07-02 DIAGNOSIS — I10 Essential (primary) hypertension: Secondary | ICD-10-CM | POA: Diagnosis not present

## 2023-07-02 DIAGNOSIS — Z01810 Encounter for preprocedural cardiovascular examination: Secondary | ICD-10-CM | POA: Insufficient documentation

## 2023-07-02 DIAGNOSIS — Z01812 Encounter for preprocedural laboratory examination: Secondary | ICD-10-CM | POA: Diagnosis not present

## 2023-07-02 DIAGNOSIS — Z01818 Encounter for other preprocedural examination: Secondary | ICD-10-CM | POA: Diagnosis present

## 2023-07-02 HISTORY — DX: Personal history of other diseases of the digestive system: Z87.19

## 2023-07-02 HISTORY — DX: Unspecified osteoarthritis, unspecified site: M19.90

## 2023-07-02 HISTORY — DX: Pneumonia, unspecified organism: J18.9

## 2023-07-02 LAB — BASIC METABOLIC PANEL
Anion gap: 6 (ref 5–15)
BUN: 19 mg/dL (ref 8–23)
CO2: 24 mmol/L (ref 22–32)
Calcium: 9.8 mg/dL (ref 8.9–10.3)
Chloride: 107 mmol/L (ref 98–111)
Creatinine, Ser: 1.36 mg/dL — ABNORMAL HIGH (ref 0.61–1.24)
GFR, Estimated: 56 mL/min — ABNORMAL LOW (ref >60)
Glucose, Bld: 89 mg/dL (ref 70–99)
Potassium: 4 mmol/L (ref 3.5–5.1)
Sodium: 137 mmol/L (ref 135–145)

## 2023-07-02 LAB — CBC
HCT: 40.1 % (ref 39.0–52.0)
Hemoglobin: 13.2 g/dL (ref 13.0–17.0)
MCH: 30.1 pg (ref 26.0–34.0)
MCHC: 32.9 g/dL (ref 30.0–36.0)
MCV: 91.3 fL (ref 80.0–100.0)
Platelets: 145 10*3/uL — ABNORMAL LOW (ref 150–400)
RBC: 4.39 MIL/uL (ref 4.22–5.81)
RDW: 12.9 % (ref 11.5–15.5)
WBC: 7.7 10*3/uL (ref 4.0–10.5)
nRBC: 0 % (ref 0.0–0.2)

## 2023-07-03 DIAGNOSIS — Z23 Encounter for immunization: Secondary | ICD-10-CM | POA: Diagnosis not present

## 2023-07-10 ENCOUNTER — Encounter (HOSPITAL_COMMUNITY): Payer: Self-pay | Admitting: Surgery

## 2023-07-10 DIAGNOSIS — E210 Primary hyperparathyroidism: Secondary | ICD-10-CM | POA: Diagnosis present

## 2023-07-10 NOTE — Anesthesia Preprocedure Evaluation (Signed)
Anesthesia Evaluation  Patient identified by MRN, date of birth, ID band Patient awake    Reviewed: Allergy & Precautions, NPO status , Patient's Chart, lab work & pertinent test results  Airway Mallampati: II  TM Distance: >3 FB Neck ROM: Full    Dental no notable dental hx. (+) Teeth Intact, Implants, Dental Advisory Given   Pulmonary former smoker   Pulmonary exam normal breath sounds clear to auscultation       Cardiovascular hypertension, Pt. on medications Normal cardiovascular exam Rhythm:Regular Rate:Normal  9/30 EKG SB R 55   Neuro/Psych  PSYCHIATRIC DISORDERS Anxiety     negative neurological ROS     GI/Hepatic Neg liver ROS,GERD  Controlled,,  Endo/Other  hyperparathyroidism  Renal/GU negative Renal ROS     Musculoskeletal  (+) Arthritis ,    Abdominal   Peds  Hematology Lab Results      Component                Value               Date                      WBC                      7.7                 07/02/2023                HGB                      13.2                07/02/2023                HCT                      40.1                07/02/2023                MCV                      91.3                07/02/2023                PLT                      145 (L)             07/02/2023              Anesthesia Other Findings   Reproductive/Obstetrics                             Anesthesia Physical Anesthesia Plan  ASA: 2  Anesthesia Plan: General   Post-op Pain Management: Ofirmev IV (intra-op)* and Precedex   Induction: Intravenous  PONV Risk Score and Plan: 3 and Treatment may vary due to age or medical condition, Midazolam, Ondansetron and Dexamethasone  Airway Management Planned: Oral ETT  Additional Equipment: None  Intra-op Plan:   Post-operative Plan: Extubation in OR  Informed Consent: I have reviewed the patients History and Physical, chart, labs  and discussed the procedure including the risks, benefits and alternatives for the  proposed anesthesia with the patient or authorized representative who has indicated his/her understanding and acceptance.     Dental advisory given  Plan Discussed with: CRNA  Anesthesia Plan Comments:         Anesthesia Quick Evaluation

## 2023-07-10 NOTE — H&P (Signed)
REFERRING PHYSICIAN: Shamleffer, Ibtehal  PROVIDER: Deaglan Lile Myra Rude, MD   Chief Complaint: New Consultation ( Primary hyperparathyroidism)  History of Present Illness:  Patient is referred by Dr. Lesly Dukes for surgical evaluation and recommendations regarding management of primary hyperparathyroidism. Patient has had longstanding elevated serum calcium levels. These have ranged from 10.4 to as high as 11.1. His most recent level was elevated at 10.8. Intact PTH levels have ranged as high as 134 with his most recent level being 105. 24-hour urine collection for calcium was normal at 168. 25-hydroxy vitamin D level is normal at 42.18. Patient has noted fatigue. He notes urinary frequency. He has had a bone density scan in the last year or 2 which was normal. He has had no recent fractures. He denies any history of nephrolithiasis. Patient has had no prior head or neck surgery. There is a family history of medical thyroid disease in the patient's mother. Patient has had no imaging studies. He presents today accompanied by his wife for consultation.  Review of Systems: A complete review of systems was obtained from the patient. I have reviewed this information and discussed as appropriate with the patient. See HPI as well for other ROS.  Review of Systems  Constitutional: Positive for malaise/fatigue.  HENT: Negative.  Eyes: Negative.  Respiratory: Negative.  Cardiovascular: Negative.  Gastrointestinal: Negative.  Genitourinary: Positive for frequency.  Musculoskeletal: Negative.  Skin: Negative.  Neurological: Negative.  Endo/Heme/Allergies: Negative.  Psychiatric/Behavioral: Negative.    Medical History: Past Medical History:  Diagnosis Date  GERD (gastroesophageal reflux disease)  Hyperlipidemia  Hypertension   Patient Active Problem List  Diagnosis  Hyperparathyroidism (CMS/HHS-HCC)   Past Surgical History:  Procedure Laterality Date  HERNIA REPAIR   TONSILLECTOMY  VASECTOMY    No Known Allergies  Current Outpatient Medications on File Prior to Visit  Medication Sig Dispense Refill  prazosin (MINIPRESS) 5 MG capsule Take 5 mg by mouth 2 (two) times daily  rosuvastatin (CRESTOR) 20 MG tablet Take 20 mg by mouth once daily  sertraline (ZOLOFT) 100 MG tablet Take 1 tablet by mouth once daily   No current facility-administered medications on file prior to visit.   Family History  Problem Relation Age of Onset  Diabetes Mother  High blood pressure (Hypertension) Father  Coronary Artery Disease (Blocked arteries around heart) Father    Social History   Tobacco Use  Smoking Status Former  Types: Cigarettes  Start date: 1984  Smokeless Tobacco Never    Social History   Socioeconomic History  Marital status: Married  Tobacco Use  Smoking status: Former  Types: Cigarettes  Start date: 1984  Smokeless tobacco: Never  Substance and Sexual Activity  Alcohol use: Not Currently  Drug use: Never   Social Determinants of Health   Financial Resource Strain: Low Risk (01/16/2022)  Received from [facility]  Overall Financial Resource Strain (CARDIA)  Difficulty of Paying Living Expenses: Not hard at all  Food Insecurity: No Food Insecurity (02/07/2023)  [facility]  Hunger Vital Sign  Worried About Running Out of Food in the Last Year: Never true  Ran Out of Food in the Last Year: Never true  Transportation Needs: No Transportation Needs (02/07/2023)  [facility] from [facility] - Transportation  Lack of Transportation (Medical): No  Lack of Transportation (Non-Medical): No  Physical Activity: Sufficiently Active (02/07/2023)  Received from [facility]  Exercise Vital Sign  Days of Exercise per Week: 7 days  Minutes of  Exercise per Session: 60 min  Stress: No Stress Concern Present (01/16/2022)  Received from [facility] - Occupational Stress Questionnaire   Feeling of Stress : Not at all  Social Connections: Moderately Isolated (01/16/2022)  Received from [facility]  Social Connection and Isolation Panel [NHANES]  Frequency of Communication with Friends and Family: More than three times a week  Frequency of Social Gatherings with Friends and Family: More than three times a week  Attends Religious Services: Never  Database administrator or Organizations: No  Attends Engineer, structural: Never  Marital Status: Married   Objective:   Vitals:  BP: 124/71  Pulse: 76  Temp: 36.7 C (98 F)  SpO2: 98%  Weight: 89.4 kg (197 lb)  Height: 177.8 cm (5\' 10" )   Body mass index is 28.27 kg/m.  Physical Exam   GENERAL APPEARANCE Comfortable, no acute issues Development: normal Gross deformities: none  SKIN Rash, lesions, ulcers: none Induration, erythema: none Nodules: none palpable  EYES Conjunctiva and lids: normal Pupils: equal and reactive  EARS, NOSE, MOUTH, THROAT External ears: no lesion or deformity External nose: no lesion or deformity Hearing: grossly normal  NECK Symmetric: yes Trachea: midline Thyroid: no palpable nodules in the thyroid bed  ABDOMEN Not assessed  GENITOURINARY/RECTAL Not assessed  MUSCULOSKELETAL Station and gait: normal Digits and nails: no clubbing or cyanosis Muscle strength: grossly normal all extremities Range of motion: grossly normal all extremities Deformity: none  LYMPHATIC Cervical: none palpable Supraclavicular: none palpable  PSYCHIATRIC Oriented to person, place, and time: yes Mood and affect: normal for situation Judgment and insight: appropriate for situation   Assessment and Plan:   Hyperparathyroidism (CMS/HHS-HCC)  Patient is referred by his endocrinologist, Dr. Lesly Dukes, for surgical evaluation and recommendations regarding management of primary hyperparathyroidism.  Patient provided with a copy of "Parathyroid Surgery: Treatment for Your  Parathyroid Gland Problem", published by [employer], 12 pages. Book reviewed and explained to patient during visit today.  Today we reviewed his clinical history. We reviewed his recent laboratory studies. We discussed further evaluation with imaging studies to include an ultrasound and a nuclear medicine parathyroid scan with sestamibi. We discussed the fact that most people have single gland disease. We discussed minimally invasive parathyroidectomy. We discussed doing this as an outpatient procedure. We discussed the postoperative recovery to be anticipated. We discussed the potential need for a 4D CT scan with parathyroid protocol if the initial studies were not conclusive and confirming his diagnosis and indicating the position of the adenoma. The patient and his wife understand and agree to proceed with testing.  Patient will undergo the above studies. We will contact him with the results when they are available and make plans for further management at that time.   Darnell Level, MD [facility] practice Office: [phone]

## 2023-07-11 ENCOUNTER — Other Ambulatory Visit: Payer: Self-pay

## 2023-07-11 ENCOUNTER — Encounter (HOSPITAL_COMMUNITY): Payer: Self-pay | Admitting: Surgery

## 2023-07-11 ENCOUNTER — Ambulatory Visit (HOSPITAL_COMMUNITY): Payer: Medicare Other | Admitting: Certified Registered"

## 2023-07-11 ENCOUNTER — Telehealth: Payer: Self-pay

## 2023-07-11 ENCOUNTER — Ambulatory Visit (HOSPITAL_COMMUNITY)
Admission: RE | Admit: 2023-07-11 | Discharge: 2023-07-11 | Disposition: A | Discharge status: Home / Self Care | Payer: Medicare Other | Source: Ambulatory Visit | Attending: Surgery | Admitting: Surgery

## 2023-07-11 ENCOUNTER — Ambulatory Visit (HOSPITAL_BASED_OUTPATIENT_CLINIC_OR_DEPARTMENT_OTHER): Payer: Self-pay | Admitting: Certified Registered"

## 2023-07-11 ENCOUNTER — Encounter (HOSPITAL_COMMUNITY)
Admission: RE | Disposition: A | Discharge status: Home / Self Care | Payer: Self-pay | Source: Ambulatory Visit | Attending: Surgery

## 2023-07-11 DIAGNOSIS — Z87891 Personal history of nicotine dependence: Secondary | ICD-10-CM | POA: Diagnosis not present

## 2023-07-11 DIAGNOSIS — E210 Primary hyperparathyroidism: Secondary | ICD-10-CM

## 2023-07-11 DIAGNOSIS — K219 Gastro-esophageal reflux disease without esophagitis: Secondary | ICD-10-CM | POA: Diagnosis not present

## 2023-07-11 DIAGNOSIS — Z9852 Vasectomy status: Secondary | ICD-10-CM | POA: Insufficient documentation

## 2023-07-11 DIAGNOSIS — I10 Essential (primary) hypertension: Secondary | ICD-10-CM | POA: Insufficient documentation

## 2023-07-11 DIAGNOSIS — D351 Benign neoplasm of parathyroid gland: Secondary | ICD-10-CM | POA: Diagnosis not present

## 2023-07-11 DIAGNOSIS — Z8249 Family history of ischemic heart disease and other diseases of the circulatory system: Secondary | ICD-10-CM | POA: Diagnosis not present

## 2023-07-11 HISTORY — PX: PARATHYROIDECTOMY: SHX19

## 2023-07-11 SURGERY — PARATHYROIDECTOMY
Anesthesia: General | Laterality: Right

## 2023-07-11 MED ORDER — ONDANSETRON HCL 4 MG/2ML IJ SOLN
INTRAMUSCULAR | Status: DC | PRN
  Administered 2023-07-11: 4 mg via INTRAVENOUS

## 2023-07-11 MED ORDER — GLYCOPYRROLATE 0.2 MG/ML IJ SOLN
INTRAMUSCULAR | Status: AC
  Filled 2023-07-11: qty 1

## 2023-07-11 MED ORDER — DEXMEDETOMIDINE HCL IN NACL 80 MCG/20ML IV SOLN
INTRAVENOUS | Status: AC
  Filled 2023-07-11: qty 20

## 2023-07-11 MED ORDER — PROPOFOL 10 MG/ML IV BOLUS
INTRAVENOUS | Status: DC | PRN
  Administered 2023-07-11: 130 mg via INTRAVENOUS

## 2023-07-11 MED ORDER — SUGAMMADEX SODIUM 200 MG/2ML IV SOLN
INTRAVENOUS | Status: DC | PRN
  Administered 2023-07-11: 200 mg via INTRAVENOUS

## 2023-07-11 MED ORDER — LACTATED RINGERS IV SOLN: INTRAVENOUS | Status: DC

## 2023-07-11 MED ORDER — CHLORHEXIDINE GLUCONATE 0.12 % MT SOLN
15.0000 mL | Freq: Once | OROMUCOSAL | Status: AC
  Administered 2023-07-11: 15 mL via OROMUCOSAL

## 2023-07-11 MED ORDER — ROCURONIUM BROMIDE 10 MG/ML (PF) SYRINGE
PREFILLED_SYRINGE | INTRAVENOUS | Status: DC | PRN
  Administered 2023-07-11: 70 mg via INTRAVENOUS

## 2023-07-11 MED ORDER — TRAMADOL HCL 50 MG PO TABS
50.0000 mg | ORAL_TABLET | Freq: Four times a day (QID) | ORAL | 0 refills | Status: DC | PRN
Start: 2023-07-11 — End: 2023-08-27

## 2023-07-11 MED ORDER — EPHEDRINE 5 MG/ML INJ
INTRAVENOUS | Status: AC
  Filled 2023-07-11: qty 10

## 2023-07-11 MED ORDER — BUPIVACAINE HCL (PF) 0.25 % IJ SOLN
INTRAMUSCULAR | Status: AC
  Filled 2023-07-11: qty 30

## 2023-07-11 MED ORDER — MIDAZOLAM HCL 2 MG/2ML IJ SOLN
INTRAMUSCULAR | Status: DC | PRN
  Administered 2023-07-11: 2 mg via INTRAVENOUS

## 2023-07-11 MED ORDER — FENTANYL CITRATE (PF) 100 MCG/2ML IJ SOLN
INTRAMUSCULAR | Status: AC
  Filled 2023-07-11: qty 2

## 2023-07-11 MED ORDER — LIDOCAINE 2% (20 MG/ML) 5 ML SYRINGE
INTRAMUSCULAR | Status: DC | PRN
  Administered 2023-07-11: 40 mg via INTRAVENOUS

## 2023-07-11 MED ORDER — PHENYLEPHRINE 80 MCG/ML (10ML) SYRINGE FOR IV PUSH (FOR BLOOD PRESSURE SUPPORT)
PREFILLED_SYRINGE | INTRAVENOUS | Status: AC
  Filled 2023-07-11: qty 10

## 2023-07-11 MED ORDER — FENTANYL CITRATE PF 50 MCG/ML IJ SOSY: 25.0000 ug | PREFILLED_SYRINGE | INTRAMUSCULAR | Status: DC | PRN

## 2023-07-11 MED ORDER — EPHEDRINE SULFATE-NACL 50-0.9 MG/10ML-% IV SOSY
PREFILLED_SYRINGE | INTRAVENOUS | Status: DC | PRN
  Administered 2023-07-11: 5 mg via INTRAVENOUS
  Administered 2023-07-11 (×2): 10 mg via INTRAVENOUS
  Administered 2023-07-11: 5 mg via INTRAVENOUS
  Administered 2023-07-11 (×2): 10 mg via INTRAVENOUS

## 2023-07-11 MED ORDER — CEFAZOLIN SODIUM-DEXTROSE 2-3 GM-%(50ML) IV SOLR
INTRAVENOUS | Status: DC | PRN
Start: 2023-07-11 — End: 2023-07-11
  Administered 2023-07-11: 2 g via INTRAVENOUS

## 2023-07-11 MED ORDER — DEXMEDETOMIDINE HCL IN NACL 80 MCG/20ML IV SOLN
INTRAVENOUS | Status: DC | PRN
Start: 2023-07-11 — End: 2023-07-11
  Administered 2023-07-11: 8 ug via INTRAVENOUS

## 2023-07-11 MED ORDER — ROCURONIUM BROMIDE 10 MG/ML (PF) SYRINGE
PREFILLED_SYRINGE | INTRAVENOUS | Status: AC
  Filled 2023-07-11: qty 10

## 2023-07-11 MED ORDER — BUPIVACAINE HCL 0.25 % IJ SOLN
INTRAMUSCULAR | Status: DC | PRN
  Administered 2023-07-11: 10 mL

## 2023-07-11 MED ORDER — MIDAZOLAM HCL 2 MG/2ML IJ SOLN
INTRAMUSCULAR | Status: AC
  Filled 2023-07-11: qty 2

## 2023-07-11 MED ORDER — VASOPRESSIN 20 UNIT/ML IV SOLN
INTRAVENOUS | Status: AC
  Filled 2023-07-11: qty 1

## 2023-07-11 MED ORDER — ONDANSETRON HCL 4 MG/2ML IJ SOLN
INTRAMUSCULAR | Status: AC
  Filled 2023-07-11: qty 2

## 2023-07-11 MED ORDER — 0.9 % SODIUM CHLORIDE (POUR BTL) OPTIME
TOPICAL | Status: DC | PRN
  Administered 2023-07-11: 1000 mL

## 2023-07-11 MED ORDER — PHENYLEPHRINE 80 MCG/ML (10ML) SYRINGE FOR IV PUSH (FOR BLOOD PRESSURE SUPPORT)
PREFILLED_SYRINGE | INTRAVENOUS | Status: DC | PRN
  Administered 2023-07-11: 80 ug via INTRAVENOUS

## 2023-07-11 MED ORDER — PROPOFOL 10 MG/ML IV BOLUS
INTRAVENOUS | Status: AC
  Filled 2023-07-11: qty 20

## 2023-07-11 MED ORDER — DEXAMETHASONE SODIUM PHOSPHATE 10 MG/ML IJ SOLN
INTRAMUSCULAR | Status: AC
  Filled 2023-07-11: qty 1

## 2023-07-11 MED ORDER — GLYCOPYRROLATE 0.2 MG/ML IJ SOLN
INTRAMUSCULAR | Status: DC | PRN
Start: 2023-07-11 — End: 2023-07-11
  Administered 2023-07-11 (×2): .1 mg via INTRAVENOUS

## 2023-07-11 MED ORDER — HEMOSTATIC AGENTS (NO CHARGE) OPTIME
TOPICAL | Status: DC | PRN
  Administered 2023-07-11: 1 via TOPICAL

## 2023-07-11 MED ORDER — ACETAMINOPHEN 10 MG/ML IV SOLN: 1000.0000 mg | Freq: Once | INTRAVENOUS | Status: DC | PRN

## 2023-07-11 MED ORDER — FENTANYL CITRATE (PF) 100 MCG/2ML IJ SOLN
INTRAMUSCULAR | Status: DC | PRN
  Administered 2023-07-11: 100 ug via INTRAVENOUS

## 2023-07-11 MED ORDER — ONDANSETRON HCL 4 MG/2ML IJ SOLN: 4.0000 mg | Freq: Once | INTRAMUSCULAR | Status: DC | PRN

## 2023-07-11 MED ORDER — DEXAMETHASONE SODIUM PHOSPHATE 10 MG/ML IJ SOLN
INTRAMUSCULAR | Status: DC | PRN
  Administered 2023-07-11: 4 mg via INTRAVENOUS

## 2023-07-11 MED ORDER — ORAL CARE MOUTH RINSE: 15.0000 mL | Freq: Once | OROMUCOSAL | Status: AC

## 2023-07-11 SURGICAL SUPPLY — 35 items
ADH SKN CLS APL DERMABOND .7 (GAUZE/BANDAGES/DRESSINGS) ×1
APL PRP STRL LF DISP 70% ISPRP (MISCELLANEOUS) ×1
ATTRACTOMAT 16X20 MAGNETIC DRP (DRAPES) ×1 IMPLANT
BAG COUNTER SPONGE SURGICOUNT (BAG) ×1 IMPLANT
BAG SPNG CNTER NS LX DISP (BAG) ×1
BLADE SURG 15 STRL LF DISP TIS (BLADE) ×1 IMPLANT
BLADE SURG 15 STRL SS (BLADE) ×1
CHLORAPREP W/TINT 26 (MISCELLANEOUS) ×1 IMPLANT
CLIP TI MEDIUM 6 (CLIP) ×2 IMPLANT
CLIP TI WIDE RED SMALL 6 (CLIP) ×2 IMPLANT
COVER SURGICAL LIGHT HANDLE (MISCELLANEOUS) ×1 IMPLANT
DERMABOND ADVANCED .7 DNX12 (GAUZE/BANDAGES/DRESSINGS) ×1 IMPLANT
DRAPE LAPAROTOMY T 98X78 PEDS (DRAPES) ×1 IMPLANT
DRAPE UTILITY XL STRL (DRAPES) ×1 IMPLANT
ELECT REM PT RETURN 15FT ADLT (MISCELLANEOUS) ×1 IMPLANT
GAUZE 4X4 16PLY ~~LOC~~+RFID DBL (SPONGE) ×1 IMPLANT
GLOVE SURG ORTHO 8.0 STRL STRW (GLOVE) ×1 IMPLANT
GOWN STRL REUS W/ TWL XL LVL3 (GOWN DISPOSABLE) ×3 IMPLANT
GOWN STRL REUS W/TWL XL LVL3 (GOWN DISPOSABLE) ×3
HEMOSTAT SURGICEL 2X4 FIBR (HEMOSTASIS) ×1 IMPLANT
ILLUMINATOR WAVEGUIDE N/F (MISCELLANEOUS) IMPLANT
KIT BASIN OR (CUSTOM PROCEDURE TRAY) ×1 IMPLANT
KIT TURNOVER KIT A (KITS) IMPLANT
NDL HYPO 22X1.5 SAFETY MO (MISCELLANEOUS) ×1 IMPLANT
NEEDLE HYPO 22X1.5 SAFETY MO (MISCELLANEOUS) ×1
PACK BASIC VI WITH GOWN DISP (CUSTOM PROCEDURE TRAY) ×1 IMPLANT
PENCIL SMOKE EVACUATOR (MISCELLANEOUS) ×1 IMPLANT
SHEARS HARMONIC 9CM CVD (BLADE) IMPLANT
SUT MNCRL AB 4-0 PS2 18 (SUTURE) ×1 IMPLANT
SUT VIC AB 3-0 SH 18 (SUTURE) ×1 IMPLANT
SYR BULB IRRIG 60ML STRL (SYRINGE) ×1 IMPLANT
SYR CONTROL 10ML LL (SYRINGE) ×1 IMPLANT
TOWEL OR 17X26 10 PK STRL BLUE (TOWEL DISPOSABLE) ×1 IMPLANT
TOWEL OR NON WOVEN STRL DISP B (DISPOSABLE) ×1 IMPLANT
TUBING CONNECTING 10 (TUBING) ×1 IMPLANT

## 2023-07-11 NOTE — Op Note (Signed)
OPERATIVE REPORT - PARATHYROIDECTOMY  Preoperative diagnosis: Primary hyperparathyroidism  Postop diagnosis: Same  Procedure: Right minimally invasive parathyroidectomy  Surgeon:  Darnell Level, MD  Assistant:  Saunders Glance, PA-C  Anesthesia: General endotracheal  Estimated blood loss: Minimal  Preparation: ChloraPrep  Indications: Patient is referred by Dr. Lesly Dukes for surgical evaluation and recommendations regarding management of primary hyperparathyroidism. Patient has had longstanding elevated serum calcium levels. These have ranged from 10.4 to as high as 11.1. His most recent level was elevated at 10.8. Intact PTH levels have ranged as high as 134 with his most recent level being 105. 24-hour urine collection for calcium was normal at 168. 25-hydroxy vitamin D level is normal at 42.18. Patient has noted fatigue. He notes urinary frequency. He has had a bone density scan in the last year or 2 which was normal. He has had no recent fractures. He denies any history of nephrolithiasis. Patient has had no prior head or neck surgery. 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  Patient now comes to surgery for parathyroidectomy.  Procedure: The patient was prepared in the pre-operative holding area. The patient was brought to the operating room and placed in a supine position on the operating room table. Following administration of general anesthesia, the patient was positioned and then prepped and draped in the usual strict aseptic fashion. After ascertaining that an adequate level of anesthesia been achieved, a neck incision was made with a #15 blade. Dissection was carried through subcutaneous tissues and platysma. Hemostasis was obtained with the electrocautery. Skin flaps were developed circumferentially and a Weitlander retractor was placed for exposure.  Strap muscles were incised in the midline. Strap muscles were reflected laterally exposing the thyroid lobe. With gentle  blunt dissection the thyroid lobe was mobilized.  Dissection was carried posteriorly and an enlarged parathyroid gland was identified posterior to the mid right thyroid lobe. It was gently mobilized. Vascular structures were divided between small ligaclips. Care was taken to avoid the recurrent laryngeal nerve which was visualized immediately posteriorly. The parathyroid gland was completely excised. It was submitted to pathology where frozen section by Dr. Jimmy Picket confirmed hypercellular parathyroid tissue consistent with adenoma.  Neck was irrigated with warm saline and good hemostasis was noted. Fibrillar was placed in the operative field. Strap muscles were approximated in the midline with interrupted 3-0 Vicryl sutures. Platysma was closed with interrupted 3-0 Vicryl sutures. Marcaine was infiltrated circumferentially. Skin was closed with a running 4-0 Monocryl subcuticular suture. Wound was washed and dried and Dermabond was applied. Patient was awakened from anesthesia and brought to the recovery room. The patient tolerated the procedure well.   Darnell Level, MD [facility] Surgery Office: [phone]

## 2023-07-11 NOTE — Telephone Encounter (Signed)
Patient spouse [name] states that patient just finished having a parathyroidectomy and Dr. Gerrit Friends would like you to place a referral to cardiology.

## 2023-07-11 NOTE — Interval H&P Note (Signed)
History and Physical Interval Note:  07/11/2023 8:31 AM  Matthew Walsh  has presented today for surgery, with the diagnosis of primary hyperparathyroidism.  The various methods of treatment have been discussed with the patient and family. After consideration of risks, benefits and other options for treatment, the patient has consented to    Procedure(s): right PARATHYROIDECTOMY (Right) as a surgical intervention.    The patient's history has been reviewed, patient examined, no change in status, stable for surgery.  I have reviewed the patient's chart and labs.  Questions were answered to the patient's satisfaction.    Darnell Level, MD [facility] practice Office: [phone]   Darnell Level

## 2023-07-11 NOTE — Discharge Instructions (Addendum)

## 2023-07-11 NOTE — Transfer of Care (Signed)
Immediate Anesthesia Transfer of Care Note  Patient: Matthew Walsh  Procedure(s) Performed: right PARATHYROIDECTOMY (Right)  Patient Location: PACU  Anesthesia Type:General  Level of Consciousness: awake, alert , and patient cooperative  Airway & Oxygen Therapy: Patient Spontanous Breathing and Patient connected to face mask oxygen  Post-op Assessment: Report given to RN and Post -op Vital signs reviewed and stable  Post vital signs: Reviewed and stable  Last Vitals:  Vitals Value Taken Time  BP 147/80 07/11/23 1008  Temp    Pulse 69 07/11/23 1011  Resp 16 07/11/23 1011  SpO2 100 % 07/11/23 1011  Vitals shown include unfiled device data.  Last Pain:  Vitals:   07/11/23 0712  TempSrc:   PainSc: 0-No pain         Complications: No notable events documented.

## 2023-07-11 NOTE — Anesthesia Postprocedure Evaluation (Signed)
Anesthesia Post Note  Patient: Matthew Walsh  Procedure(s) Performed: right PARATHYROIDECTOMY (Right)     Patient location during evaluation: PACU Anesthesia Type: General Level of consciousness: awake and alert Pain management: pain level controlled Vital Signs Assessment: post-procedure vital signs reviewed and stable Respiratory status: spontaneous breathing, nonlabored ventilation, respiratory function stable and patient connected to nasal cannula oxygen Cardiovascular status: blood pressure returned to baseline and stable Postop Assessment: no apparent nausea or vomiting Anesthetic complications: no   No notable events documented.  Last Vitals:  Vitals:   07/11/23 1045 07/11/23 1100  BP: 130/72 132/76  Pulse: (!) 59 (!) 58  Resp: 15 15  Temp:    SpO2: 95% 96%    Last Pain:  Vitals:   07/11/23 1030  TempSrc:   PainSc: 0-No pain                 Trevor Iha

## 2023-07-11 NOTE — Anesthesia Procedure Notes (Signed)
Procedure Name: Intubation Date/Time: 07/11/2023 8:51 AM  Performed by: Sindy Guadeloupe, CRNAPre-anesthesia Checklist: Patient identified, Emergency Drugs available, Suction available, Patient being monitored and Timeout performed Patient Re-evaluated:Patient Re-evaluated prior to induction Oxygen Delivery Method: Circle system utilized Preoxygenation: Pre-oxygenation with 100% oxygen Induction Type: IV induction Ventilation: Mask ventilation without difficulty Laryngoscope Size: Mac and 4 Grade View: Grade I Tube type: Oral Tube size: 7.5 mm Number of attempts: 1 Airway Equipment and Method: Stylet Placement Confirmation: ETT inserted through vocal cords under direct vision, positive ETCO2 and breath sounds checked- equal and bilateral Secured at: 23 cm Tube secured with: Tape Dental Injury: Teeth and Oropharynx as per pre-operative assessment

## 2023-07-12 ENCOUNTER — Encounter (HOSPITAL_COMMUNITY): Payer: Self-pay | Admitting: Surgery

## 2023-07-12 LAB — SURGICAL PATHOLOGY

## 2023-07-13 ENCOUNTER — Ambulatory Visit: Payer: Medicare Other | Admitting: Internal Medicine

## 2023-07-13 ENCOUNTER — Encounter: Payer: Self-pay | Admitting: Internal Medicine

## 2023-07-13 VITALS — BP 122/80 | HR 58 | Ht 70.0 in | Wt 198.0 lb

## 2023-07-13 DIAGNOSIS — I441 Atrioventricular block, second degree: Secondary | ICD-10-CM

## 2023-07-13 DIAGNOSIS — Z9089 Acquired absence of other organs: Secondary | ICD-10-CM

## 2023-07-13 DIAGNOSIS — E892 Postprocedural hypoparathyroidism: Secondary | ICD-10-CM

## 2023-07-13 DIAGNOSIS — M629 Disorder of muscle, unspecified: Secondary | ICD-10-CM

## 2023-07-13 LAB — BASIC METABOLIC PANEL
BUN: 22 mg/dL (ref 6–23)
CO2: 29 meq/L (ref 19–32)
Calcium: 9.5 mg/dL (ref 8.4–10.5)
Chloride: 103 meq/L (ref 96–112)
Creatinine, Ser: 1.29 mg/dL (ref 0.40–1.50)
GFR: 55.99 mL/min — ABNORMAL LOW (ref >60.00)
Glucose, Bld: 89 mg/dL (ref 70–99)
Potassium: 4.8 meq/L (ref 3.5–5.1)
Sodium: 140 meq/L (ref 135–145)

## 2023-07-13 LAB — VITAMIN D 25 HYDROXY (VIT D DEFICIENCY, FRACTURES): VITD: 37.86 ng/mL (ref 30.00–100.00)

## 2023-07-13 LAB — ALBUMIN: Albumin: 4.5 g/dL (ref 3.5–5.2)

## 2023-07-13 LAB — TSH: TSH: 3.64 u[IU]/mL (ref 0.35–5.50)

## 2023-07-13 LAB — T4, FREE: Free T4: 0.88 ng/dL (ref 0.60–1.60)

## 2023-07-13 NOTE — Progress Notes (Signed)
Name: Matthew Walsh  MRN/ DOB: [id number], Dec 15, 1950    Age/ Sex: 72 y.o., male    PCP: Olive Bass, FNP   Reason for Endocrinology Evaluation: Hyperparathyroidism      Date of Initial Endocrinology Evaluation: 11/02/2021    HPI: [name] is a 72 y.o. male with a past medical history of Dyslipidemia and BPH. The patient presented for initial endocrinology clinic visit on 11/02/2021 for consultative assistance with his Hyperparathyroidism .   [name] indicates that he was first diagnosed with hypercalcemia in 2018,with a max serum level of  11.1 mg/dL in 81/1914 ( uncorrected) and PTH level of 134 pg/mL. Since that time, he has experienced symptoms of  polydipsia, but no polyuria, or constipation   No history of  lithium, or HCTZ use   He denies  history of kidney stones, kidney disease, liver disease, granulomatous disease.  DXA normal 11/2021  24-hour urinary calcium excretion was 168 mg 11/2021   He has hx of  broken finger at age 72.   He denies family history of osteoporosis, parathyroid disease. Mother with  thyroid disease.   He is s/p right parathyroidectomy 71/06/2023   SUBJECTIVE:     Today (07/13/23):  [name] is here for follow-up on hyperparathyroidism. He is accompanied by his spouse today     He is S/P right parathyroidectomy 07/2023, intra-operatively he was noted with abnormal heart rhythm and a 20% reduction in his BP  Denies dizziness  Denies chest pain  Denies SOB  Denies syncope  Denies LE edema  No peri-oral tingling  Has noted hand numbness intermittently for a whole but  recently  Has noted most consistent symtpoms  Denies joint pains  Denies joint aches         HISTORY:  Past Medical History:  Past Medical History:  Diagnosis Date   Arthritis    Asthma    As a child   Diverticulitis    GERD (gastroesophageal reflux disease)    History of hiatal hernia    Hyperlipidemia    Hypertension     Pneumonia    Past Surgical History:  Past Surgical History:  Procedure Laterality Date   HERNIA REPAIR     PARATHYROIDECTOMY Right 07/11/2023   Procedure: right PARATHYROIDECTOMY;  Surgeon: Darnell Level, MD;  Location: [facility] ORS;  Service: General;  Laterality: Right;   TONSILLECTOMY AND ADENOIDECTOMY      Social History:  reports that he has quit smoking. He has never used smokeless tobacco. He reports that he does not currently use alcohol. He reports that he does not use drugs. Family History: family history includes Alcohol abuse in his brother, father, and maternal grandmother; Diabetes in his maternal grandmother and mother; Early death in his father; Hearing loss in his father, maternal grandfather, and maternal grandmother; Heart attack in his mother; Heart disease in his father, maternal grandfather, maternal grandmother, paternal grandfather, and paternal grandmother; Hyperlipidemia in his father; Hypertension in his father; Miscarriages / [place] in his mother.   HOME MEDICATIONS: Allergies as of 07/13/2023   No Known Allergies      Medication List        Accurate as of July 13, 2023 10:54 AM. If you have any questions, ask your nurse or doctor.          prazosin 5 MG capsule Commonly known as: MINIPRESS Take 1 capsule by mouth twice daily   rosuvastatin 20 MG tablet Commonly known as: CRESTOR Take 1 tablet  by mouth once daily   sertraline 100 MG tablet Commonly known as: ZOLOFT Take 1 tablet by mouth once daily   traMADol 50 MG tablet Commonly known as: ULTRAM Take 1 tablet (50 mg total) by mouth every 6 (six) hours as needed for moderate pain.   valACYclovir 1000 MG tablet Commonly known as: VALTREX TAKE 2 TABLETS BY MOUTH 1 TO 2 TIMES DAILY What changed:  how much to take how to take this when to take this additional instructions          REVIEW OF SYSTEMS: A comprehensive ROS was conducted with the patient and is negative except as per  HPI      OBJECTIVE:  VS: BP 122/80 (BP Location: Right Arm, Patient Position: Sitting, Cuff Size: Large)   Pulse (!) 58   Ht 5\' 10"  (1.778 m)   Wt 198 lb (89.8 kg)   SpO2 98%   BMI 28.41 kg/m    Wt Readings from Last 3 Encounters:  07/13/23 198 lb (89.8 kg)  07/11/23 197 lb 1.5 oz (89.4 kg)  07/02/23 197 lb (89.4 kg)     EXAM: General: Pt appears well and is in NAD  Neck: General: Supple without adenopathy. Anterior surgical scar clean  Lungs: Clear with good BS bilat   Heart: Auscultation: RRR.  Abdomen: soft, nontender  Extremities:  BL LE: No pretibial edema   Mental Status: Judgment, insight: Intact Orientation: Oriented to time, place, and person Mood and affect: No depression, anxiety, or agitation     DATA REVIEWED:   Latest Reference Range & Units 07/13/23 11:19  Sodium 135 - 145 mEq/L 140  Potassium 3.5 - 5.1 mEq/L 4.8  Chloride 96 - 112 mEq/L 103  CO2 19 - 32 mEq/L 29  Glucose 70 - 99 mg/dL 89  BUN 6 - 23 mg/dL 22  Creatinine 8.29 - 5.62 mg/dL 1.30  Calcium 8.4 - 86.5 mg/dL 9.5  Albumin 3.5 - 5.2 g/dL 4.5  GFR >78.46 mL/min 55.99 (L)    Latest Reference Range & Units 07/13/23 11:19  VITD 30.00 - 100.00 ng/mL 37.86    Latest Reference Range & Units 07/13/23 11:19  TSH 0.35 - 5.50 uIU/mL 3.64  T4,Free(Direct) 0.60 - 1.60 ng/dL 9.62      ASSESSMENT/PLAN/RECOMMENDATIONS:   Primary Hyperparathyroidism:  -S/P parathyroidectomy  -Serum calcium has normalized as well as vitamin D    2. Second degree heart block :  -Based on EKG traces it appears that the patient may have second-degree heart block, this is beyond the scope of endocrinology -A referral to cardiology has been placed  I spent 36 minutes preparing to see the patient by review of recent labs, imaging and procedures, obtaining and reviewing separately obtained history, communicating with the patient, ordering medications, tests or procedures, and documenting clinical information in  the EHR including the differential Dx, treatment, and any further evaluation and other management    Signed electronically by: Lyndle Herrlich, MD  [facility] Endocrinology  [facility] [address] [place] [place], [place] [address] Phone: [phone] FAX: [phone]   CC: Olive Bass, FNP [address] [place] [address] Phone: [phone] Fax: [phone]   Return to Endocrinology clinic as below: Future Appointments  Date Time Provider Department Center  08/06/2023  9:10 AM Skeeter Sheard, Konrad Dolores, MD [facility] None  08/24/2023  8:20 AM Olive Bass, FNP [facility]  08/27/2023  8:40 AM Myrlene Broker, MD [facility] None

## 2023-07-16 ENCOUNTER — Ambulatory Visit: Payer: Medicare Other | Attending: Cardiovascular Disease | Admitting: Cardiovascular Disease

## 2023-07-16 VITALS — BP 140/70 | HR 64 | Ht 70.0 in | Wt 196.0 lb

## 2023-07-16 DIAGNOSIS — R001 Bradycardia, unspecified: Secondary | ICD-10-CM | POA: Insufficient documentation

## 2023-07-16 NOTE — Patient Instructions (Signed)
Medication Instructions:  No changes *If you need a refill on your cardiac medications before your next appointment, please call your pharmacy*   Lab Work: none   Testing/Procedures: Your physician has requested that you have an echocardiogram. Echocardiography is a painless test that uses sound waves to create images of your heart. It provides your doctor with information about the size and shape of your heart and how well your heart's chambers and valves are working. This procedure takes approximately one hour. There are no restrictions for this procedure. Please do NOT wear cologne, perfume, aftershave, or lotions (deodorant is allowed). Please arrive 15 minutes prior to your appointment time.    Follow-Up: At [facility], you and your health needs are our priority.  As part of our continuing mission to provide you with exceptional heart care, we have created designated Provider Care Teams.  These Care Teams include your primary Cardiologist (physician) and Advanced Practice Providers (APPs -  Physician Assistants and Nurse Practitioners) who all work together to provide you with the care you need, when you need it.    Your next appointment:   12 month(s)  Provider:   Lauree Chandler, MD

## 2023-07-16 NOTE — Progress Notes (Signed)
Chief Complaint  Patient presents with   New Patient (Initial Visit)    Abnormal EKG   History of Present Illness: 72 yo male with history of hyperparathyroidism, hiatal hernia, hyperlipidemia and HTN who is here today as a new consult, referred by Dr. Lonzo Cloud, for the evaluation of abnormal EKG. He had a parathyroidectomy on 07/11/23 and during his procedure was noted to have a drop in his BP while under general anesthesia. (Reported as 20% drop in BP by wife). He was seen in Endocrine clinic 07/12/22 and EKG showed sinus bradycardia. His wife showed me strips given to her by the surgeon after his procedure and this is sinus. He tells me today that he has never had any heart problems. He has no chest pain, dizziness, near syncope or syncope.   Primary Care Physician: Olive Bass, FNP   Past Medical History:  Diagnosis Date   Arthritis    Asthma    As a child   BPH (benign prostatic hyperplasia)    Diverticulitis    GERD (gastroesophageal reflux disease)    History of hiatal hernia    Hypercalcemia 2018   Hyperlipidemia    Hyperparathyroidism (HCC)    Hypertension    Pneumonia    Polydipsia     Past Surgical History:  Procedure Laterality Date   HERNIA REPAIR     PARATHYROIDECTOMY Right 07/11/2023   Procedure: right PARATHYROIDECTOMY;  Surgeon: Darnell Level, MD;  Location: [facility];  Service: General;  Laterality: Right;   TONSILLECTOMY AND ADENOIDECTOMY      Current Outpatient Medications  Medication Sig Dispense Refill   prazosin (MINIPRESS) 5 MG capsule Take 1 capsule by mouth twice daily 180 capsule 3   rosuvastatin (CRESTOR) 20 MG tablet Take 1 tablet by mouth once daily 90 tablet 3   sertraline (ZOLOFT) 100 MG tablet Take 1 tablet by mouth once daily 90 tablet 3   valACYclovir (VALTREX) 1000 MG tablet TAKE 2 TABLETS BY MOUTH 1 TO 2 TIMES DAILY (Patient taking differently: Take 1,000-2,000 mg by mouth See admin instructions. Take 2000 mg twice daily at the  first sign of fever blisters, then take 1000 mg twice daily as needed) 60 tablet 3   traMADol (ULTRAM) 50 MG tablet Take 1 tablet (50 mg total) by mouth every 6 (six) hours as needed for moderate pain. (Patient not taking: Reported on 07/16/2023) 12 tablet 0   No current facility-administered medications for this visit.    No Known Allergies  Social History   Socioeconomic History   Marital status: Married    Spouse name: Not on file   Number of children: 1   Years of education: Not on file   Highest education level: Not on file  Occupational History   Occupation: Actuary in Occupational hygienist  Tobacco Use   Smoking status: Former    Types: Cigarettes   Smokeless tobacco: Never  Vaping Use   Vaping status: Never Used  Substance and Sexual Activity   Alcohol use: Not Currently   Drug use: No   Sexual activity: Yes    Partners: Female  Other Topics Concern   Not on file  Social History Narrative   Fun- retired, just moved to area.   Neighborhood clubs   Social Determinants of Health   Financial Resource Strain: Low Risk  (01/16/2022)   Overall Financial Resource Strain (CARDIA)    Difficulty of Paying Living Expenses: Not hard at all  Food Insecurity: No Food Insecurity (02/07/2023)  Hunger Vital Sign    Worried About Running Out of Food in the Last Year: Never true    Ran Out of Food in the Last Year: Never true  Transportation Needs: No Transportation Needs (02/07/2023)   PRAPARE - Administrator, Civil Service (Medical): No    Lack of Transportation (Non-Medical): No  Physical Activity: Sufficiently Active (02/07/2023)   Exercise Vital Sign    Days of Exercise per Week: 7 days    Minutes of Exercise per Session: 60 min  Stress: No Stress Concern Present (01/16/2022)   [employer] of Occupational Health - Occupational Stress Questionnaire    Feeling of Stress : Not at all  Social Connections: Moderately Isolated (01/16/2022)    Social Connection and Isolation Panel [NHANES]    Frequency of Communication with Friends and Family: More than three times a week    Frequency of Social Gatherings with Friends and Family: More than three times a week    Attends Religious Services: Never    Database administrator or Organizations: No    Attends Banker Meetings: Never    Marital Status: Married  Catering manager Violence: Not At Risk (02/07/2023)   Humiliation, Afraid, Rape, and Kick questionnaire    Fear of Current or Ex-Partner: No    Emotionally Abused: No    Physically Abused: No    Sexually Abused: No    Family History  Problem Relation Age of Onset   Diabetes Mother    Heart attack Mother    Miscarriages / [place] Mother    Alcohol abuse Father    Early death Father    Hearing loss Father    Heart disease Father 64   Hyperlipidemia Father    Hypertension Father    Alcohol abuse Brother    Alcohol abuse Maternal Grandmother    Diabetes Maternal Grandmother    Hearing loss Maternal Grandmother    Heart disease Maternal Grandmother    Hearing loss Maternal Grandfather    Heart disease Maternal Grandfather    Heart disease Paternal Grandmother    Heart disease Paternal Grandfather     Review of Systems:  As stated in the HPI and otherwise negative.   BP (!) 140/70 (BP Location: Left Arm, Patient Position: Sitting, Cuff Size: Normal)   Pulse 64   Ht 5\' 10"  (1.778 m)   Wt 88.9 kg   SpO2 98%   BMI 28.12 kg/m   Physical Examination: General: Well developed, well nourished, NAD  HEENT: OP clear, mucus membranes moist  SKIN: warm, dry. No rashes. Neuro: No focal deficits  Musculoskeletal: Muscle strength 5/5 all ext  Psychiatric: Mood and affect normal  Neck: No JVD, no carotid bruits, no thyromegaly, no lymphadenopathy.  Lungs:Clear bilaterally, no wheezes, rhonci, crackles Cardiovascular: Regular rate and rhythm. No murmurs, gallops or rubs. Abdomen:Soft. Bowel sounds present.  Non-tender.  Extremities: No lower extremity edema. Pulses are 2 + in the bilateral DP/PT.  EKG:  EKG is not ordered today. The ekg ordered today demonstrates   Recent Labs: 08/22/2022: ALT 39 07/02/2023: Hemoglobin 13.2; Platelets 145 07/13/2023: BUN 22; Creatinine, Ser 1.29; Potassium 4.8; Sodium 140; TSH 3.64   Lipid Panel    Component Value Date/Time   CHOL 140 08/22/2022 0856   TRIG 105.0 08/22/2022 0856   HDL 39.00 (L) 08/22/2022 0856   CHOLHDL 4 08/22/2022 0856   VLDL 21.0 08/22/2022 0856   LDLCALC 80 08/22/2022 0856     Wt Readings from Last  3 Encounters:  07/16/23 88.9 kg  07/13/23 89.8 kg  07/11/23 89.4 kg    Assessment and Plan:   1. Sinus bradycardia: I do not see evidence of heart block on his EKG from last week. I reviewed rhythm strips obtained during his surgical procedure. This is also sinus. He has no dizziness. Echo to assess LVEF and exclude structural heart disease.   Labs/ tests ordered today include:  No orders of the defined types were placed in this encounter.  Disposition:   F/U with me in one year   Signed, Verne Carrow, MD, [facility] 07/16/2023 2:15 PM    [facility] [address] [place], [place], [place]  [address] Phone: [phone]; Fax: [phone]

## 2023-07-18 ENCOUNTER — Ambulatory Visit: Payer: Medicare Other | Admitting: Internal Medicine

## 2023-07-25 DIAGNOSIS — Z9089 Acquired absence of other organs: Secondary | ICD-10-CM | POA: Diagnosis not present

## 2023-07-25 DIAGNOSIS — E213 Hyperparathyroidism, unspecified: Secondary | ICD-10-CM | POA: Diagnosis not present

## 2023-07-25 DIAGNOSIS — Z9889 Other specified postprocedural states: Secondary | ICD-10-CM | POA: Diagnosis not present

## 2023-08-06 ENCOUNTER — Ambulatory Visit: Payer: Medicare Other | Admitting: Internal Medicine

## 2023-08-07 DIAGNOSIS — R972 Elevated prostate specific antigen [PSA]: Secondary | ICD-10-CM | POA: Diagnosis not present

## 2023-08-14 DIAGNOSIS — R972 Elevated prostate specific antigen [PSA]: Secondary | ICD-10-CM | POA: Diagnosis not present

## 2023-08-15 ENCOUNTER — Ambulatory Visit (HOSPITAL_COMMUNITY): Payer: Medicare Other | Attending: Cardiology

## 2023-08-15 DIAGNOSIS — R001 Bradycardia, unspecified: Secondary | ICD-10-CM | POA: Insufficient documentation

## 2023-08-15 LAB — ECHOCARDIOGRAM COMPLETE
Area-P 1/2: 3.6 cm2
S' Lateral: 3 cm

## 2023-08-24 ENCOUNTER — Ambulatory Visit: Payer: Medicare Other | Admitting: Family

## 2023-08-27 ENCOUNTER — Encounter: Payer: Self-pay | Admitting: Internal Medicine

## 2023-08-27 ENCOUNTER — Ambulatory Visit (INDEPENDENT_AMBULATORY_CARE_PROVIDER_SITE_OTHER): Payer: Medicare Other | Admitting: Internal Medicine

## 2023-08-27 VITALS — BP 144/80 | HR 62 | Temp 97.7°F | Ht 70.0 in | Wt 202.0 lb

## 2023-08-27 DIAGNOSIS — D1801 Hemangioma of skin and subcutaneous tissue: Secondary | ICD-10-CM | POA: Diagnosis not present

## 2023-08-27 DIAGNOSIS — N400 Enlarged prostate without lower urinary tract symptoms: Secondary | ICD-10-CM | POA: Diagnosis not present

## 2023-08-27 DIAGNOSIS — E785 Hyperlipidemia, unspecified: Secondary | ICD-10-CM | POA: Diagnosis not present

## 2023-08-27 DIAGNOSIS — I10 Essential (primary) hypertension: Secondary | ICD-10-CM

## 2023-08-27 DIAGNOSIS — L738 Other specified follicular disorders: Secondary | ICD-10-CM | POA: Diagnosis not present

## 2023-08-27 DIAGNOSIS — R7303 Prediabetes: Secondary | ICD-10-CM

## 2023-08-27 DIAGNOSIS — L603 Nail dystrophy: Secondary | ICD-10-CM | POA: Diagnosis not present

## 2023-08-27 DIAGNOSIS — F4310 Post-traumatic stress disorder, unspecified: Secondary | ICD-10-CM

## 2023-08-27 DIAGNOSIS — L821 Other seborrheic keratosis: Secondary | ICD-10-CM | POA: Diagnosis not present

## 2023-08-27 DIAGNOSIS — L570 Actinic keratosis: Secondary | ICD-10-CM | POA: Diagnosis not present

## 2023-08-27 LAB — LIPID PANEL
Cholesterol: 155 mg/dL (ref 0–200)
HDL: 37.9 mg/dL — ABNORMAL LOW (ref >39.00)
LDL Cholesterol: 91 mg/dL (ref 0–99)
NonHDL: 117.26
Total CHOL/HDL Ratio: 4
Triglycerides: 129 mg/dL (ref 0.0–149.0)
VLDL: 25.8 mg/dL (ref 0.0–40.0)

## 2023-08-27 LAB — HEMOGLOBIN A1C: Hgb A1c MFr Bld: 6.2 % (ref 4.6–6.5)

## 2023-08-27 NOTE — Assessment & Plan Note (Signed)
Taking zoloft 100 mg daily and well controlled. Will continue.

## 2023-08-27 NOTE — Progress Notes (Signed)
   Subjective:   Patient ID: Matthew Walsh, male    DOB: 12/08/1950, 72 y.o.   MRN: [id number]  HPI The patient is a 72 YO man coming in for transfer of care and with ongoing health concerns (see a/p)  PMH, FMH, social history reviewed and updated  Review of Systems  Constitutional: Negative.   HENT: Negative.    Eyes: Negative.   Respiratory:  Negative for cough, chest tightness and shortness of breath.   Cardiovascular:  Negative for chest pain, palpitations and leg swelling.  Gastrointestinal:  Negative for abdominal distention, abdominal pain, constipation, diarrhea, nausea and vomiting.  Musculoskeletal: Negative.   Skin: Negative.   Neurological: Negative.   Psychiatric/Behavioral: Negative.      Objective:  Physical Exam Constitutional:      Appearance: He is well-developed.  HENT:     Head: Normocephalic and atraumatic.  Cardiovascular:     Rate and Rhythm: Normal rate and regular rhythm.  Pulmonary:     Effort: Pulmonary effort is normal. No respiratory distress.     Breath sounds: Normal breath sounds. No wheezing or rales.  Abdominal:     General: Bowel sounds are normal. There is no distension.     Palpations: Abdomen is soft.     Tenderness: There is no abdominal tenderness. There is no rebound.  Musculoskeletal:     Cervical back: Normal range of motion.  Skin:    General: Skin is warm and dry.  Neurological:     Mental Status: He is alert and oriented to person, place, and time.     Coordination: Coordination normal.     Vitals:   08/27/23 0839 08/27/23 0846  BP: (!) 144/80 (!) 144/80  Pulse: 62   Temp: 97.7 F (36.5 C)   TempSrc: Oral   SpO2: 98%   Weight: 202 lb (91.6 kg)   Height: 5\' 10"  (1.778 m)     Assessment & Plan:  Visit time 25 minutes in face to face communication with patient and coordination of care, additional 15 minutes spent in record review, coordination or care, ordering tests, communicating/referring to other healthcare  professionals, documenting in medical records all on the same day of the visit for total time 40 minutes spent on the visit.

## 2023-08-27 NOTE — Assessment & Plan Note (Signed)
Taking finasteride 5 mg daily and adequate control. Seeing urology for elevated PSA chronically this was just checked a few weeks ago.

## 2023-08-27 NOTE — Assessment & Plan Note (Signed)
BP is borderline and normal at recent visits. Will keep prazosin 5 mg BID for now.

## 2023-08-27 NOTE — Patient Instructions (Addendum)
We will check the labs today. 

## 2023-08-27 NOTE — Assessment & Plan Note (Signed)
Checking lipid panel and adjust crestor 20 mg daily as needed.  

## 2023-08-27 NOTE — Assessment & Plan Note (Signed)
Checking HgA1c and adjust as needed.

## 2023-09-14 ENCOUNTER — Other Ambulatory Visit: Payer: Self-pay | Admitting: Family

## 2023-09-14 DIAGNOSIS — F4310 Post-traumatic stress disorder, unspecified: Secondary | ICD-10-CM

## 2023-09-29 ENCOUNTER — Encounter: Payer: Self-pay | Admitting: Internal Medicine

## 2023-10-01 ENCOUNTER — Telehealth: Payer: Self-pay | Admitting: Internal Medicine

## 2023-10-01 ENCOUNTER — Other Ambulatory Visit: Payer: Self-pay

## 2023-10-01 DIAGNOSIS — I10 Essential (primary) hypertension: Secondary | ICD-10-CM

## 2023-10-01 DIAGNOSIS — F4310 Post-traumatic stress disorder, unspecified: Secondary | ICD-10-CM

## 2023-10-01 DIAGNOSIS — R972 Elevated prostate specific antigen [PSA]: Secondary | ICD-10-CM

## 2023-10-01 MED ORDER — VALACYCLOVIR HCL 1 G PO TABS: ORAL_TABLET | ORAL | 3 refills | Status: AC

## 2023-10-01 MED ORDER — SERTRALINE HCL 100 MG PO TABS: 100.0000 mg | ORAL_TABLET | Freq: Every day | ORAL | 3 refills | Status: DC

## 2023-10-01 MED ORDER — PRAZOSIN HCL 5 MG PO CAPS: 5.0000 mg | ORAL_CAPSULE | Freq: Two times a day (BID) | ORAL | 3 refills | Status: DC

## 2023-10-01 MED ORDER — ROSUVASTATIN CALCIUM 20 MG PO TABS: 20.0000 mg | ORAL_TABLET | Freq: Every day | ORAL | 3 refills | Status: DC

## 2023-10-01 NOTE — Telephone Encounter (Signed)
Copied from CRM [id number]. Topic: Clinical - Medication Refill >> Oct 01, 2023  9:51 AM Ernst Spell wrote: Most Recent Primary Care Visit:  Provider: Hillard Danker A  Department: [facility]  Visit Type: NEW PATIENT  Date: 08/27/2023  Medication: finasteride (PROSCAR) 5 MG tablet; rosuvastatin (CRESTOR) 20 MG tablet; prazosin (MINIPRESS) 5 MG capsule; sertraline (ZOLOFT) 100 MG tablet  Has the patient contacted their pharmacy? Yes Patient stated that per his pharmacy, none of the meds were refillable.   -- pt also stated he recently saw Dr. Okey Dupre in November and he switched PCP's prior to establishing care with Dr. Okey Dupre.   Is this the correct pharmacy for this prescription? Yes  This is the patient's preferred pharmacy:  [facility] [address], [place] - [address] [place] [address] Phone: [phone] Fax: [phone]   Has the prescription been filled recently? No  Is the patient out of the medication? No  Has the patient been seen for an appointment in the last year OR does the patient have an upcoming appointment? Yes  Can we respond through MyChart? Yes  Agent: Please be advised that Rx refills may take up to 3 business days. We ask that you follow-up with your pharmacy.

## 2023-10-01 NOTE — Telephone Encounter (Signed)
This has already been addressed

## 2023-10-01 NOTE — Telephone Encounter (Signed)
Ok to send in refills? 

## 2023-11-01 ENCOUNTER — Telehealth: Payer: Medicare Other | Admitting: Physician Assistant

## 2023-11-01 DIAGNOSIS — M79652 Pain in left thigh: Secondary | ICD-10-CM

## 2023-11-01 MED ORDER — PREDNISONE 10 MG (21) PO TBPK
ORAL_TABLET | ORAL | 0 refills | Status: DC
Start: 2023-11-01 — End: 2024-08-25

## 2023-11-01 NOTE — Patient Instructions (Addendum)
Matthew Walsh, thank you for joining Piedad Climes, PA-C for today's virtual visit.  While this provider is not your primary care provider (PCP), if your PCP is located in our provider database this encounter information will be shared with them immediately following your visit.   A [facility] MyChart account gives you access to today's visit and all your visits, tests, and labs performed at [facility] " click here if you don't have a [facility] MyChart account or go to [email or web address]  Consent: (Patient) Matthew Walsh provided verbal consent for this virtual visit at the beginning of the encounter.  Current Medications:  Current Outpatient Medications:    finasteride (PROSCAR) 5 MG tablet, Take 5 mg by mouth daily., Disp: , Rfl:    prazosin (MINIPRESS) 5 MG capsule, Take 1 capsule (5 mg total) by mouth 2 (two) times daily., Disp: 180 capsule, Rfl: 3   rosuvastatin (CRESTOR) 20 MG tablet, Take 1 tablet (20 mg total) by mouth daily., Disp: 90 tablet, Rfl: 3   sertraline (ZOLOFT) 100 MG tablet, Take 1 tablet (100 mg total) by mouth daily., Disp: 90 tablet, Rfl: 3   valACYclovir (VALTREX) 1000 MG tablet, TAKE 2 TABLETS BY MOUTH 1 TO 2 TIMES DAILY, Disp: 60 tablet, Rfl: 3   Medications ordered in this encounter:  No orders of the defined types were placed in this encounter.    *If you need refills on other medications prior to your next appointment, please contact your pharmacy*  Follow-Up: Call back or seek an in-person evaluation if the symptoms worsen or if the condition fails to improve as anticipated.  [facility] [phone]  Other Instructions Please stop use of the recumbent bike for now. Elevate the leg while resting. Tylenol OTC. Take the steroid pack as directed. Apply heat to the area in 10-15 minute intervals. You can alternate with ice, but at this point, unsure how effective the ice will be.  I want you to follow-up  with your PCP or at one of the sports medicine clinics listed below.  [facility] -- Same-Day [facility]   Monday - Friday   11AM-7PM   Schedule via website (see below) or as walk-in   [address]   [place], [place] [address]   [phone]   [facility]  [place], [place]  [place]      [facility]   [address],  [place], [place] [address]   GET DIRECTIONS   CONTACT   Phone: [phone]   Phone: [phone]   URGENT CARE HOURS   Monday - Friday:   8:00am to 8:00pm   Saturday:   10:00am to 3:00pm         [facility]   [address]  [place], [place] [address]   GET DIRECTIONS   CONTACT   Phone: [phone]   URGENT CARE HOURS   Monday:   9:00AM - 9:00PM   Tuesday:   9:00AM - 9:00PM   Wednesday:   9:00AM - 9:00PM   Thursday:   9:00AM - 9:00PM   Friday:   9:00AM - 9:00PM   Saturday:   9:00AM - 9:00PM   Sunday:   9:00AM - 9:00PM   HOLIDAY HOURS   Holidays:   8:30AM - 4:30PM      [place]   After Hours [facility] [phone]      Orthopedic Urgent [address] [place], [place] [address]  EVENINGS & WEEKENDS NO APPOINTMENT NECESSARY Mon-Fri 5:30PM - 9PM    Sat 9 AM - 2 PM Sun 10 AM - 2 PM      [facility]   [address]  [address]  [place], [place] [address]   [phone]      OFFICE HOURS:   MONDAY - FRIDAY : 8:00 A.M. - 4:00 P.M.      [facility]  [address]  [place], [place] [address]   [phone]      OFFICE HOURS:   MONDAY - FRIDAY: 8:00 A.M. - 8:30 P.M.   SATURDAY: 10:00 A.M. - 2:00 P.M.          If you have been instructed to have an in-person evaluation today at a local Urgent Care facility, please use the link below. It will take you to a list of all of our available  [facility], including address, phone number and hours of operation. Please do not delay care.  [facility]  If you or a family member do not have a primary care provider, use the link below to schedule a visit and establish care. When you choose a [facility] primary care physician or advanced practice provider, you gain a long-term partner in health. Find a Primary Care Provider  Learn more about [facility]'s in-office and virtual care options:  - Get Care Now

## 2023-11-01 NOTE — Progress Notes (Signed)
Virtual Visit Consent   Matthew Walsh, you are scheduled for a virtual visit with a [facility] provider today. Just as with appointments in the office, your consent must be obtained to participate. Your consent will be active for this visit and any virtual visit you may have with one of our providers in the next 365 days. If you have a MyChart account, a copy of this consent can be sent to you electronically.  As this is a virtual visit, video technology does not allow for your provider to perform a traditional examination. This may limit your provider's ability to fully assess your condition. If your provider identifies any concerns that need to be evaluated in person or the need to arrange testing (such as labs, EKG, etc.), we will make arrangements to do so. Although advances in technology are sophisticated, we cannot ensure that it will always work on either your end or our end. If the connection with a video visit is poor, the visit may have to be switched to a telephone visit. With either a video or telephone visit, we are not always able to ensure that we have a secure connection.  By engaging in this virtual visit, you consent to the provision of healthcare and authorize for your insurance to be billed (if applicable) for the services provided during this visit. Depending on your insurance coverage, you may receive a charge related to this service.  I need to obtain your verbal consent now. Are you willing to proceed with your visit today? Matthew Walsh has provided verbal consent on 11/01/2023 for a virtual visit (video or telephone). Matthew Walsh, [place]  Date: 11/01/2023 5:46 PM  Virtual Visit via Video Note   I, Matthew Walsh, connected with  Matthew Walsh  ([id number], 1951-09-24) on 11/01/23 at  5:30 PM EST by a video-enabled telemedicine application and verified that I am speaking with the correct person using two identifiers.  Location: Patient: Virtual Visit  Location Patient: Home Provider: Virtual Visit Location Provider: Home Office   I discussed the limitations of evaluation and management by telemedicine and the availability of in person appointments. The patient expressed understanding and agreed to proceed.    History of Present Illness: Matthew Walsh is a 73 y.o. who identifies as a male who was assigned male at birth, and is being seen today for intermittent pain and discomfort of distal anterolateral left thigh for the past 3-4 weeks. Initially was mild and noted occasionally with walking. Notes he got a recumbent bike and started exercising with symptoms becoming more significant and frequent after use. Notes got worse a few days ago after going with his wife to a "show" for her birthday where they had to climb a lot of steps. Notes sometimes the pain will radiate to lateral knee and occasionally down into distal lower extremity. Denies numbness, tingling or weakness. Notes sometimes feels his thigh wants to "give out". Denies falls.   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   Trigger little finger of right hand 08/10/2020   Trigger little finger of left hand 08/10/2020   Trigger finger, left middle finger 08/10/2020   Trigger finger, right middle finger 12/10/2019   Pre-diabetes 01/19/2018   Encounter for general adult medical examination with abnormal findings 01/16/2018   BPH (benign prostatic hyperplasia) 08/20/2017   Hyperlipidemia 08/20/2017   Hypertension 08/20/2017   Alcohol use 08/20/2017  PTSD (post-traumatic stress disorder) 08/20/2017    Allergies: No Known Allergies Medications:  Current Outpatient Medications:    finasteride (PROSCAR) 5 MG tablet, Take 5 mg by mouth daily., Disp: , Rfl:    prazosin (MINIPRESS) 5 MG capsule, Take 1 capsule (5 mg total) by mouth 2 (two) times daily., Disp: 180 capsule, Rfl: 3    rosuvastatin (CRESTOR) 20 MG tablet, Take 1 tablet (20 mg total) by mouth daily., Disp: 90 tablet, Rfl: 3   sertraline (ZOLOFT) 100 MG tablet, Take 1 tablet (100 mg total) by mouth daily., Disp: 90 tablet, Rfl: 3   valACYclovir (VALTREX) 1000 MG tablet, TAKE 2 TABLETS BY MOUTH 1 TO 2 TIMES DAILY, Disp: 60 tablet, Rfl: 3  Observations/Objective: Patient is well-developed, well-nourished in no acute distress.  Resting comfortably at home.  Head is normocephalic, atraumatic.  No labored breathing. Speech is clear and coherent with logical content.  Patient is alert and oriented at baseline.  Unable to fully evaluate the lower extremity due to being via video and due to camera mobility.  Assessment and Plan: 1. Left thigh pain (Primary)  Subacute at this point but worsening due to recent activities. Discussed limitations of assessment via video and recommend in person follow-up with PCP or walk-in sports medicine clinic. For now, will have him elevate leg while resting. Heating pad use discussed. Tylenol OTC. Since he does not tolerate NSAIDs well, will give prednisone tablet pack. In person eval ASAP if any new or worsening symptoms.  Follow Up Instructions: I discussed the assessment and treatment plan with the patient. The patient was provided an opportunity to ask questions and all were answered. The patient agreed with the plan and demonstrated an understanding of the instructions.  A copy of instructions were sent to the patient via MyChart unless otherwise noted below.   The patient was advised to call back or seek an in-person evaluation if the symptoms worsen or if the condition fails to improve as anticipated.    Matthew Climes, PA-C

## 2024-01-28 DIAGNOSIS — R972 Elevated prostate specific antigen [PSA]: Secondary | ICD-10-CM | POA: Diagnosis not present

## 2024-01-30 DIAGNOSIS — H35711 Central serous chorioretinopathy, right eye: Secondary | ICD-10-CM | POA: Diagnosis not present

## 2024-02-04 DIAGNOSIS — R351 Nocturia: Secondary | ICD-10-CM | POA: Diagnosis not present

## 2024-02-04 DIAGNOSIS — R972 Elevated prostate specific antigen [PSA]: Secondary | ICD-10-CM | POA: Diagnosis not present

## 2024-02-04 DIAGNOSIS — R350 Frequency of micturition: Secondary | ICD-10-CM | POA: Diagnosis not present

## 2024-07-03 DIAGNOSIS — Z23 Encounter for immunization: Secondary | ICD-10-CM | POA: Diagnosis not present

## 2024-07-17 ENCOUNTER — Ambulatory Visit: Admitting: Orthopaedic Surgery

## 2024-07-17 DIAGNOSIS — M65321 Trigger finger, right index finger: Secondary | ICD-10-CM | POA: Diagnosis not present

## 2024-07-17 DIAGNOSIS — M65331 Trigger finger, right middle finger: Secondary | ICD-10-CM

## 2024-07-17 NOTE — Progress Notes (Signed)
 Office Visit Note   Patient: Matthew Walsh           Date of Birth: 1951/07/01           MRN: [id number] Visit Date: 07/17/2024              Requested by: Rollene Almarie LABOR, MD [address] [place],  [place] [address] PCP: Rollene Almarie LABOR, MD   Assessment & Plan: Visit Diagnoses:  1. Trigger finger, right middle finger   2. Trigger finger, right index finger     Plan: History of Present Illness Matthew Walsh is a 73 year old male who presents with pain and limited function in the right index and middle fingers due to trigger fingers.  He experiences significant pain and dysfunction in the right index finger, which he describes as 'pretty much useless.' Daily activities such as inserting a key into a door and tying his shoes are challenging. Pain is localized to a specific area of the right index finger, and there is difficulty moving the finger, although it does not lock.  He has undergone multiple injections for trigger finger, specifically three injections in the right long finger, but continues to experience pain and limited function.  He is a retired Emergency planning/management officer and expresses concern about his ability to perform tasks such as making a fist or defending himself, impacting his consideration of participating in a ride-along with his daughter-in-law, who is also in Patent examiner. No diabetes is present.  Physical Exam MUSCULOSKELETAL: Pain and limited arc of motion of index and middle fingers.  Tenderness at the A1 pulleys..  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 Right index finger severely affected. Previous injections provided temporary relief. - Schedule A1 pulley surgical release for right index and middle fingers at ambulatory surgical center. - Avoid lifting over ten pounds for four weeks post-surgery. - Detailed surgical  plan discussed.  Follow-Up Instructions: No follow-ups on file.   Orders:  No orders of the defined types were placed in this encounter.  No orders of the defined types were placed in this encounter.     Procedures: No procedures performed   Clinical Data: No additional findings.   Subjective: Chief Complaint  Patient presents with   Right Hand - Pain   Left Hand - Pain    HPI  Review of Systems  Constitutional: Negative.   HENT: Negative.    Eyes: Negative.   Respiratory: Negative.    Cardiovascular: Negative.   Gastrointestinal: Negative.   Endocrine: Negative.   Genitourinary: Negative.   Skin: Negative.   Allergic/Immunologic: Negative.   Neurological: Negative.   Hematological: Negative.   Psychiatric/Behavioral: Negative.    All other systems reviewed and are negative.    Objective: Vital Signs: There were no vitals taken for this visit.  Physical Exam Vitals and nursing note reviewed.  Constitutional:      Appearance: He is well-developed.  HENT:     Head: Normocephalic and atraumatic.  Eyes:     Pupils: Pupils are equal, round, and reactive to light.  Pulmonary:     Effort: Pulmonary effort is normal.  Abdominal:     Palpations: Abdomen is soft.  Musculoskeletal:        General: Normal range of motion.     Cervical back: Neck supple.  Skin:    General: Skin is warm.  Neurological:  Mental Status: He is alert and oriented to person, place, and time.  Psychiatric:        Behavior: Behavior normal.        Thought Content: Thought content normal.        Judgment: Judgment normal.     Ortho Exam  Specialty Comments:  No specialty comments available.  Imaging: No results found.   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   Trigger little finger of right hand 08/10/2020   Trigger little finger of left hand 08/10/2020   Trigger finger, right index finger  08/10/2020   Trigger finger, right middle finger 12/10/2019   Pre-diabetes 01/19/2018   Encounter for general adult medical examination with abnormal findings 01/16/2018   BPH (benign prostatic hyperplasia) 08/20/2017   Hyperlipidemia 08/20/2017   Hypertension 08/20/2017   Alcohol use 08/20/2017   PTSD (post-traumatic stress disorder) 08/20/2017   Past Medical History:  Diagnosis Date   Arthritis    Asthma    As a child   BPH (benign prostatic hyperplasia)    Diverticulitis    GERD (gastroesophageal reflux disease)    History of hiatal hernia    Hypercalcemia 2018   Hyperlipidemia    Hyperparathyroidism    Hypertension    Pneumonia    Polydipsia     Family History  Problem Relation Age of Onset   Diabetes Mother    Heart attack Mother    Miscarriages / [place] Mother    Alcohol abuse Father    Early death Father    Hearing loss Father    Heart disease Father 54   Hyperlipidemia Father    Hypertension Father    Alcohol abuse Brother    Alcohol abuse Maternal Grandmother    Diabetes Maternal Grandmother    Hearing loss Maternal Grandmother    Heart disease Maternal Grandmother    Hearing loss Maternal Grandfather    Heart disease Maternal Grandfather    Heart disease Paternal Grandmother    Heart disease Paternal Grandfather     Past Surgical History:  Procedure Laterality Date   HERNIA REPAIR     PARATHYROIDECTOMY Right 07/11/2023   Procedure: right PARATHYROIDECTOMY;  Surgeon: Eletha Boas, MD;  Location: [facility];  Service: General;  Laterality: Right;   TONSILLECTOMY AND ADENOIDECTOMY     Social History   Occupational History   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

## 2024-07-21 ENCOUNTER — Telehealth: Payer: Self-pay | Admitting: Orthopaedic Surgery

## 2024-07-21 NOTE — Telephone Encounter (Signed)
 Called patient to offer surgery date for right middle trigger finger release.  No answer.  Left message on voicemail for patient to call back.  Provided name and direct number for scheduling.

## 2024-07-25 ENCOUNTER — Other Ambulatory Visit: Payer: Self-pay | Admitting: Physician Assistant

## 2024-07-25 MED ORDER — HYDROCODONE-ACETAMINOPHEN 5-325 MG PO TABS
1.0000 | ORAL_TABLET | Freq: Three times a day (TID) | ORAL | 0 refills | Status: DC | PRN
Start: 2024-07-25 — End: 2024-08-14

## 2024-07-25 MED ORDER — ONDANSETRON HCL 4 MG PO TABS: 4.0000 mg | ORAL_TABLET | Freq: Three times a day (TID) | ORAL | 0 refills | Status: DC | PRN

## 2024-07-29 ENCOUNTER — Other Ambulatory Visit: Payer: Self-pay | Admitting: Physician Assistant

## 2024-07-31 ENCOUNTER — Other Ambulatory Visit: Payer: Self-pay | Admitting: Physician Assistant

## 2024-07-31 ENCOUNTER — Other Ambulatory Visit: Payer: Self-pay | Admitting: Internal Medicine

## 2024-07-31 DIAGNOSIS — R972 Elevated prostate specific antigen [PSA]: Secondary | ICD-10-CM

## 2024-07-31 DIAGNOSIS — F4310 Post-traumatic stress disorder, unspecified: Secondary | ICD-10-CM

## 2024-07-31 DIAGNOSIS — I10 Essential (primary) hypertension: Secondary | ICD-10-CM

## 2024-07-31 NOTE — Telephone Encounter (Signed)
 Copied from CRM [id number]. Topic: Clinical - Medication Refill >> Jul 31, 2024  2:58 PM Suzen RAMAN wrote: Medication: sertraline  (ZOLOFT ) 100 MG tablet,rosuvastatin  (CRESTOR ) 20 MG tablet,prazosin  (MINIPRESS ) 5 MG capsule  Has the patient contacted their pharmacy? Yes   This is the patient's preferred pharmacy:  [facility] [address], [place] - [address] [place] [address] Phone: [phone] Fax: [phone]  Is this the correct pharmacy for this prescription? Yes If no, delete pharmacy and type the correct one.   Has the prescription been filled recently? No  Is the patient out of the medication? Yes  Has the patient been seen for an appointment in the last year OR does the patient have an upcoming appointment? Yes  Can we respond through MyChart? Yes  Agent: Please be advised that Rx refills may take up to 3 business days. We ask that you follow-up with your pharmacy.

## 2024-08-02 ENCOUNTER — Other Ambulatory Visit: Payer: Self-pay | Admitting: Internal Medicine

## 2024-08-02 DIAGNOSIS — F4310 Post-traumatic stress disorder, unspecified: Secondary | ICD-10-CM

## 2024-08-04 ENCOUNTER — Encounter: Payer: Self-pay | Admitting: Radiology

## 2024-08-05 MED ORDER — PRAZOSIN HCL 5 MG PO CAPS: 5.0000 mg | ORAL_CAPSULE | Freq: Two times a day (BID) | ORAL | 3 refills | Status: AC

## 2024-08-05 MED ORDER — ROSUVASTATIN CALCIUM 20 MG PO TABS: 20.0000 mg | ORAL_TABLET | Freq: Every day | ORAL | 3 refills | Status: AC

## 2024-08-05 MED ORDER — SERTRALINE HCL 100 MG PO TABS: 100.0000 mg | ORAL_TABLET | Freq: Every day | ORAL | 3 refills | Status: AC

## 2024-08-13 ENCOUNTER — Encounter: Admitting: Orthopaedic Surgery

## 2024-08-14 ENCOUNTER — Other Ambulatory Visit: Payer: Self-pay | Admitting: Physician Assistant

## 2024-08-14 DIAGNOSIS — M65321 Trigger finger, right index finger: Secondary | ICD-10-CM | POA: Diagnosis not present

## 2024-08-14 DIAGNOSIS — M65331 Trigger finger, right middle finger: Secondary | ICD-10-CM | POA: Diagnosis not present

## 2024-08-14 MED ORDER — HYDROCODONE-ACETAMINOPHEN 5-325 MG PO TABS: 1.0000 | ORAL_TABLET | Freq: Three times a day (TID) | ORAL | 0 refills | Status: DC | PRN

## 2024-08-14 MED ORDER — ONDANSETRON HCL 4 MG PO TABS: 4.0000 mg | ORAL_TABLET | Freq: Three times a day (TID) | ORAL | 0 refills | Status: DC | PRN

## 2024-08-25 ENCOUNTER — Encounter: Payer: Self-pay | Admitting: Internal Medicine

## 2024-08-25 ENCOUNTER — Ambulatory Visit: Admitting: Internal Medicine

## 2024-08-25 VITALS — BP 122/60 | HR 62 | Temp 97.5°F | Ht 70.0 in | Wt 206.6 lb

## 2024-08-25 DIAGNOSIS — I10 Essential (primary) hypertension: Secondary | ICD-10-CM | POA: Diagnosis not present

## 2024-08-25 DIAGNOSIS — N401 Enlarged prostate with lower urinary tract symptoms: Secondary | ICD-10-CM

## 2024-08-25 DIAGNOSIS — Z1211 Encounter for screening for malignant neoplasm of colon: Secondary | ICD-10-CM

## 2024-08-25 DIAGNOSIS — E785 Hyperlipidemia, unspecified: Secondary | ICD-10-CM | POA: Diagnosis not present

## 2024-08-25 DIAGNOSIS — R7303 Prediabetes: Secondary | ICD-10-CM | POA: Diagnosis not present

## 2024-08-25 DIAGNOSIS — F4310 Post-traumatic stress disorder, unspecified: Secondary | ICD-10-CM | POA: Diagnosis not present

## 2024-08-25 DIAGNOSIS — R350 Frequency of micturition: Secondary | ICD-10-CM | POA: Diagnosis not present

## 2024-08-25 LAB — COMPREHENSIVE METABOLIC PANEL WITH GFR
ALT: 23 U/L (ref 0–53)
AST: 17 U/L (ref 0–37)
Albumin: 4.6 g/dL (ref 3.5–5.2)
Alkaline Phosphatase: 56 U/L (ref 39–117)
BUN: 23 mg/dL (ref 6–23)
CO2: 28 meq/L (ref 19–32)
Calcium: 9.2 mg/dL (ref 8.4–10.5)
Chloride: 104 meq/L (ref 96–112)
Creatinine, Ser: 1.35 mg/dL (ref 0.40–1.50)
GFR: 52.61 mL/min — ABNORMAL LOW (ref >60.00)
Glucose, Bld: 133 mg/dL — ABNORMAL HIGH (ref 70–99)
Potassium: 4 meq/L (ref 3.5–5.1)
Sodium: 138 meq/L (ref 135–145)
Total Bilirubin: 0.9 mg/dL (ref 0.2–1.2)
Total Protein: 7.3 g/dL (ref 6.0–8.3)

## 2024-08-25 LAB — LIPID PANEL
Cholesterol: 144 mg/dL (ref 0–200)
HDL: 36.1 mg/dL — ABNORMAL LOW (ref >39.00)
LDL Cholesterol: 77 mg/dL (ref 0–99)
NonHDL: 108.3
Total CHOL/HDL Ratio: 4
Triglycerides: 158 mg/dL — ABNORMAL HIGH (ref 0.0–149.0)
VLDL: 31.6 mg/dL (ref 0.0–40.0)

## 2024-08-25 LAB — CBC
HCT: 41.3 % (ref 39.0–52.0)
Hemoglobin: 14.3 g/dL (ref 13.0–17.0)
MCHC: 34.6 g/dL (ref 30.0–36.0)
MCV: 89.5 fl (ref 78.0–100.0)
Platelets: 165 K/uL (ref 150.0–400.0)
RBC: 4.62 Mil/uL (ref 4.22–5.81)
RDW: 13.2 % (ref 11.5–15.5)
WBC: 6.8 K/uL (ref 4.0–10.5)

## 2024-08-25 LAB — HEMOGLOBIN A1C: Hgb A1c MFr Bld: 6 % (ref 4.6–6.5)

## 2024-08-25 NOTE — Progress Notes (Signed)
 "  Subjective:   Patient ID: Matthew Walsh, male    DOB: Jan 24, 1951, 73 y.o.   MRN: [id number]  Discussed the use of AI scribe software for clinical note transcription with the patient, who gave verbal consent to proceed.  History of Present Illness Matthew Walsh is a 73 year old male who presents for follow-up after trigger finger surgery. He is accompanied by his wife, [name].  He recently underwent surgery for trigger finger, resulting in significant improvement in range of motion and reduction in pain.  Approximately a month before his last visit a year ago, he had neck surgery and reports no new issues since then.  He is taking finasteride for prostate health and has noticed a decrease in nocturia.  He experiences occasional joint and muscle pain, particularly in colder weather, but no new significant musculoskeletal issues. He has a history of plantar fasciitis and neuroma, for which he wears special shoes and inserts that provide relief.  No new gastrointestinal symptoms such as stomach troubles, diarrhea, constipation, or heartburn. No new respiratory or cardiovascular symptoms such as chest pain, tightness, heart racing, or breathing troubles.  He has a history of cataracts, with a developing cataract in his left eye, which he attributes to aging. He reports some difficulty with vision despite no change in his prescription.  No new skin issues and has recently used a topical treatment for pre-cancerous lesions on his face.  No mood changes or depression, though he is frustrated at being unable to do as much as he would like due to his hand condition. He reports good sleep quality and no significant sleep disturbances.  He engages in regular walking for exercise, approximately 40 minutes per session, several times a week. He does not smoke and drinks coffee regularly, typically two cups in the morning and occasionally in the afternoon.  Review of Systems   Constitutional: Negative.   HENT: Negative.    Eyes: Negative.   Respiratory:  Negative for cough, chest tightness and shortness of breath.   Cardiovascular:  Negative for chest pain, palpitations and leg swelling.  Gastrointestinal:  Negative for abdominal distention, abdominal pain, constipation, diarrhea, nausea and vomiting.  Musculoskeletal: Negative.   Skin: Negative.   Neurological: Negative.   Psychiatric/Behavioral: Negative.      Objective:  Physical Exam Constitutional:      Appearance: He is well-developed.  HENT:     Head: Normocephalic and atraumatic.  Cardiovascular:     Rate and Rhythm: Normal rate and regular rhythm.  Pulmonary:     Effort: Pulmonary effort is normal. No respiratory distress.     Breath sounds: Normal breath sounds. No wheezing or rales.  Abdominal:     General: Bowel sounds are normal. There is no distension.     Palpations: Abdomen is soft.     Tenderness: There is no abdominal tenderness.  Musculoskeletal:     Cervical back: Normal range of motion.  Skin:    General: Skin is warm and dry.  Neurological:     Mental Status: He is alert and oriented to person, place, and time.     Coordination: Coordination normal.     Vitals:   08/25/24 1332  BP: 122/60  Pulse: 62  Temp: (!) 97.5 F (36.4 C)  TempSrc: Temporal  SpO2: 99%  Weight: 206 lb 9.6 oz (93.7 kg)  Height: 5' 10 (1.778 m)    Assessment and Plan Assessment & Plan Essential hypertension   Blood pressure is well-controlled. Continue current  regimen.  Prediabetes   No new symptoms or concerns.  Benign prostatic hyperplasia with lower urinary tract symptoms   Symptoms and nocturia are managed with finasteride.  Trigger finger, status post recent surgery   Good recovery with improved range of motion post-surgery.  Plantar fasciitis and neuroma of foot   Footwear and inserts provide relief.   "

## 2024-08-25 NOTE — Patient Instructions (Addendum)
 We will send the cologuard for you.

## 2024-08-26 NOTE — Assessment & Plan Note (Signed)
 Taking zoloft  100 mg daily for this and well controlled. Will continue.

## 2024-08-26 NOTE — Assessment & Plan Note (Signed)
 Symptoms and nocturia are managed with finasteride.

## 2024-08-26 NOTE — Assessment & Plan Note (Signed)
 Blood pressure is well-controlled. Continue current regimen and checking labs today.

## 2024-08-26 NOTE — Assessment & Plan Note (Signed)
 Checking lipid panel and adjust crestor as needed.

## 2024-08-26 NOTE — Assessment & Plan Note (Signed)
 Checking HgA1c and adjust as needed.

## 2024-08-27 ENCOUNTER — Ambulatory Visit: Admitting: Orthopaedic Surgery

## 2024-08-27 ENCOUNTER — Ambulatory Visit: Payer: Self-pay | Admitting: Internal Medicine

## 2024-08-27 DIAGNOSIS — R972 Elevated prostate specific antigen [PSA]: Secondary | ICD-10-CM | POA: Diagnosis not present

## 2024-08-27 DIAGNOSIS — M65331 Trigger finger, right middle finger: Secondary | ICD-10-CM

## 2024-08-27 DIAGNOSIS — M7542 Impingement syndrome of left shoulder: Secondary | ICD-10-CM

## 2024-08-27 DIAGNOSIS — M65321 Trigger finger, right index finger: Secondary | ICD-10-CM

## 2024-08-27 MED ORDER — MELOXICAM 15 MG PO TABS: 15.0000 mg | ORAL_TABLET | Freq: Every day | ORAL | 2 refills | Status: AC

## 2024-08-27 NOTE — Progress Notes (Signed)
 Office Visit Note   Patient: Matthew Walsh           Date of Birth: 1951-08-01           MRN: [id number] Visit Date: 08/27/2024              Requested by: Rollene Almarie LABOR, MD [address] [place],  [place] [address] PCP: Rollene Almarie LABOR, MD   Assessment & Plan: Visit Diagnoses:  1. Trigger finger, right middle finger   2. Trigger finger, right index finger   3. Impingement syndrome of left shoulder     Plan: History of Present Illness Matthew Walsh is a 73 year old male who presents for follow-up after hand surgery and evaluation of left shoulder pain.  Matthew Walsh is recovering well from hand surgery. Matthew Walsh used pain medication for 3 to 5 days postoperatively but has stopped without recurrence of significant pain. Matthew Walsh has no hand locking. Pain has decreased and Matthew Walsh can almost make a full fist with gradually increasing use of the hand.  Matthew Walsh has persistent daily left shoulder pain that can "lock up" from the elbow to the shoulder, especially at night while sleeping, which wakes him. Pain is worse when bending the arm toward his face and is located mainly on the outside of the shoulder with radiation from the elbow to the shoulder. At today's visit Matthew Walsh feels it is less bothersome. Matthew Walsh recalls being advised not to take aspirin in the past due to stomach problems.  Physical Exam MUSCULOSKELETAL: Incisions on hand appear well-healed, sensation intact on fingers. Pain on external rotation of left shoulder. Positive Hawkins impingement sign on left shoulder. Positive Neer impingement sign on left shoulder. Good strength in left shoulder. No tenderness on palpation of left shoulder. No pain on resisted extension of left elbow.  Results Procedure: Suture removal Description: Sutures were removed from the hand. Steri-strips were applied across the incision sites.  Assessment and Plan Status post right index and middle finger trigger finger release Post-operative status following  trigger finger release on the right index and middle fingers. Pain improved, no locking. Incisions healing well. Near full fist achieved, expected improvement as swelling decreases. - Removed sutures, applied Steri-Strips. - Advised against lifting or grasping over ten pounds for four weeks post-surgery. - Encouraged gradual hand use, including remote control and fork. - Recommended exercise ball for hand function.  Left shoulder impingement syndrome Chronic left shoulder pain with impingement signs. Pain likely due to impingement or bursitis, possibly age-related rotator cuff wear. Prefers conservative treatment with medication. - Prescribed ibuprofen for two weeks, once daily with antacid like Nexium or Prilosec. - Monitor response to ibuprofen, consider imaging or interventions if symptoms persist or worsen.  Follow-Up Instructions: No follow-ups on file.   Orders:  No orders of the defined types were placed in this encounter.  Meds ordered this encounter  Medications   meloxicam  (MOBIC ) 15 MG tablet    Sig: Take 1 tablet (15 mg total) by mouth daily.    Dispense:  14 tablet    Refill:  2   meloxicam  (MOBIC ) 15 MG tablet    Sig: Take 1 tablet (15 mg total) by mouth daily.    Dispense:  14 tablet    Refill:  2      Procedures: No procedures performed   Clinical Data: No additional findings.   Subjective: Chief Complaint  Patient presents with   Right Hand - Routine Post Op    R INDEX &  MIDDLE TFR-08/14/2024    HPI  Review of Systems  Constitutional: Negative.   HENT: Negative.    Eyes: Negative.   Respiratory: Negative.    Cardiovascular: Negative.   Gastrointestinal: Negative.   Endocrine: Negative.   Genitourinary: Negative.   Skin: Negative.   Allergic/Immunologic: Negative.   Neurological: Negative.   Hematological: Negative.   Psychiatric/Behavioral: Negative.    All other systems reviewed and are negative.    Objective: Vital Signs: There were  no vitals taken for this visit.  Physical Exam Vitals and nursing note reviewed.  Constitutional:      Appearance: Matthew Walsh is well-developed.  HENT:     Head: Normocephalic and atraumatic.  Eyes:     Pupils: Pupils are equal, round, and reactive to light.  Pulmonary:     Effort: Pulmonary effort is normal.  Abdominal:     Palpations: Abdomen is soft.  Musculoskeletal:        General: Normal range of motion.     Cervical back: Neck supple.  Skin:    General: Skin is warm.  Neurological:     Mental Status: Matthew Walsh is alert and oriented to person, place, and time.  Psychiatric:        Behavior: Behavior normal.        Thought Content: Thought content normal.        Judgment: Judgment normal.     Ortho Exam  Specialty Comments:  No specialty comments available.  Imaging: No results found.   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   Trigger Matthew finger of right hand 08/10/2020   Trigger Matthew finger of left hand 08/10/2020   Trigger finger, right index finger 08/10/2020   Trigger finger, right middle finger 12/10/2019   Pre-diabetes 01/19/2018   Encounter for general adult medical examination with abnormal findings 01/16/2018   BPH (benign prostatic hyperplasia) 08/20/2017   Hyperlipidemia 08/20/2017   Hypertension 08/20/2017   Alcohol use 08/20/2017   PTSD (post-traumatic stress disorder) 08/20/2017   Past Medical History:  Diagnosis Date   Arthritis    Asthma    As a child   BPH (benign prostatic hyperplasia)    Diverticulitis    GERD (gastroesophageal reflux disease)    History of hiatal hernia    Hypercalcemia 2018   Hyperlipidemia    Hyperparathyroidism    Hypertension    Pneumonia    Polydipsia     Family History  Problem Relation Age of Onset   Diabetes Mother    Heart attack Mother    Miscarriages / Stillbirths Mother    Alcohol  abuse Father    Early death Father    Hearing loss Father    Heart disease Father 57   Hyperlipidemia Father    Hypertension Father    Alcohol abuse Brother    Alcohol abuse Maternal Grandmother    Diabetes Maternal Grandmother    Hearing loss Maternal Grandmother    Heart disease Maternal Grandmother    Hearing loss Maternal Grandfather    Heart disease Maternal Grandfather    Heart disease Paternal Grandmother    Heart disease Paternal Grandfather     Past Surgical History:  Procedure Laterality Date   HERNIA REPAIR     PARATHYROIDECTOMY Right 07/11/2023   Procedure: right PARATHYROIDECTOMY;  Surgeon: Eletha Boas, MD;  Location: [facility];  Service: General;  Laterality: Right;   TONSILLECTOMY AND ADENOIDECTOMY  Social History   Occupational History   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

## 2024-09-01 ENCOUNTER — Ambulatory Visit: Payer: Medicare Other | Admitting: Internal Medicine

## 2024-09-02 DIAGNOSIS — Z1211 Encounter for screening for malignant neoplasm of colon: Secondary | ICD-10-CM | POA: Diagnosis not present

## 2024-09-02 DIAGNOSIS — R3912 Poor urinary stream: Secondary | ICD-10-CM | POA: Diagnosis not present

## 2024-09-02 DIAGNOSIS — R972 Elevated prostate specific antigen [PSA]: Secondary | ICD-10-CM | POA: Diagnosis not present

## 2024-09-07 LAB — COLOGUARD: COLOGUARD: NEGATIVE

## 2024-10-03 ENCOUNTER — Other Ambulatory Visit: Payer: Self-pay | Admitting: Orthopaedic Surgery

## 2024-10-03 NOTE — Telephone Encounter (Signed)
 Notified patient via MyChart message.

## 2024-10-03 NOTE — Telephone Encounter (Signed)
 Will defer to pcp due to most recent kidney function
